# Patient Record
Sex: Female | Born: 1965 | Race: Asian | Hispanic: No | Marital: Married | Smoking: Never smoker
Health system: Southern US, Community
[De-identification: ages and names within clinical notes are randomized; demographics above are authoritative.]

## PROBLEM LIST (undated history)

## (undated) DIAGNOSIS — I10 Essential (primary) hypertension: Secondary | ICD-10-CM

---

## 2005-01-24 ENCOUNTER — Other Ambulatory Visit: Admission: RE | Admit: 2005-01-24 | Discharge: 2005-01-24 | Payer: Self-pay | Admitting: Obstetrics and Gynecology

## 2005-03-08 ENCOUNTER — Ambulatory Visit (HOSPITAL_COMMUNITY): Admission: RE | Admit: 2005-03-08 | Discharge: 2005-03-08 | Payer: Self-pay | Admitting: Obstetrics and Gynecology

## 2005-04-18 ENCOUNTER — Ambulatory Visit (HOSPITAL_COMMUNITY): Admission: RE | Admit: 2005-04-18 | Discharge: 2005-04-18 | Payer: Self-pay | Admitting: Obstetrics and Gynecology

## 2005-05-06 ENCOUNTER — Inpatient Hospital Stay (HOSPITAL_COMMUNITY): Admission: AD | Admit: 2005-05-06 | Discharge: 2005-05-08 | Payer: Self-pay | Admitting: Obstetrics and Gynecology

## 2008-02-24 ENCOUNTER — Emergency Department (HOSPITAL_COMMUNITY): Admission: EM | Admit: 2008-02-24 | Discharge: 2008-02-25 | Payer: Self-pay | Admitting: Emergency Medicine

## 2008-03-01 ENCOUNTER — Emergency Department (HOSPITAL_COMMUNITY): Admission: EM | Admit: 2008-03-01 | Discharge: 2008-03-01 | Payer: Self-pay | Admitting: Emergency Medicine

## 2008-03-03 ENCOUNTER — Ambulatory Visit (HOSPITAL_COMMUNITY): Admission: RE | Admit: 2008-03-03 | Discharge: 2008-03-03 | Payer: Self-pay | Admitting: Emergency Medicine

## 2008-03-10 ENCOUNTER — Emergency Department (HOSPITAL_COMMUNITY): Admission: EM | Admit: 2008-03-10 | Discharge: 2008-03-10 | Payer: Self-pay | Admitting: Emergency Medicine

## 2009-07-16 ENCOUNTER — Emergency Department (HOSPITAL_COMMUNITY): Admission: EM | Admit: 2009-07-16 | Discharge: 2009-07-17 | Payer: Self-pay | Admitting: Emergency Medicine

## 2011-01-14 ENCOUNTER — Encounter: Payer: Self-pay | Admitting: Emergency Medicine

## 2011-04-01 LAB — COMPREHENSIVE METABOLIC PANEL
AST: 31 U/L (ref 0–37)
Alkaline Phosphatase: 50 U/L (ref 39–117)
BUN: 12 mg/dL (ref 6–23)
CO2: 26 mEq/L (ref 19–32)
Calcium: 8.8 mg/dL (ref 8.4–10.5)
Chloride: 110 mEq/L (ref 96–112)
GFR calc non Af Amer: >60 mL/min (ref >60)
Glucose, Bld: 121 mg/dL — ABNORMAL HIGH (ref 70–99)
Potassium: 3.2 mEq/L — ABNORMAL LOW (ref 3.5–5.1)
Sodium: 141 mEq/L (ref 135–145)
Total Bilirubin: 0.7 mg/dL (ref 0.3–1.2)

## 2011-04-01 LAB — DIFFERENTIAL
Basophils Absolute: 0 10*3/uL (ref 0.0–0.1)
Eosinophils Relative: 0 % (ref 0–5)
Neutrophils Relative %: 78 % — ABNORMAL HIGH (ref 43–77)

## 2011-04-01 LAB — CBC
Hemoglobin: 13.2 g/dL (ref 12.0–15.0)
MCV: 91.8 fL (ref 78.0–100.0)
RDW: 12.8 % (ref 11.5–15.5)

## 2011-04-01 LAB — ACETAMINOPHEN LEVEL: Acetaminophen (Tylenol), Serum: <10 ug/mL — ABNORMAL LOW (ref 10–30)

## 2011-04-01 LAB — RAPID URINE DRUG SCREEN, HOSP PERFORMED
Amphetamines: NOT DETECTED
Barbiturates: NOT DETECTED
Benzodiazepines: NOT DETECTED

## 2011-04-01 LAB — SALICYLATE LEVEL: Salicylate Lvl: <4 mg/dL (ref 2.8–20.0)

## 2011-05-11 NOTE — H&P (Signed)
NAMESAHARRA, SANTO                   ACCOUNT NO.:  [id number]   MEDICAL RECORD NO.:  [id number]          PATIENT TYPE:  INP   LOCATION:  9123                          FACILITY:  [facility]   PHYSICIAN:  Crist Fat. Rivard, M.D. DATE OF BIRTH:  [birth year]   DATE OF ADMISSION:  05/06/2005  DATE OF DISCHARGE:                                HISTORY & PHYSICAL   HISTORY OF PRESENT ILLNESS:  Ms. Silveria is a 45 year old gravida 4, para 3-0-0-  3, who presents at 39-5/7 weeks, EDD May 08, 2005, by ultrasound.  She  presents by EMS in early active labor.  The patient is [place]  and speaks limited [place].  Her interpreter is present and she reports  positive fetal movement, no vaginal bleeding, no rupture of membranes.  She  denies any headache, visual changes, or epigastric pain.  Her pregnancy has  been followed by the M.D. service of [facility] and is remarkable for:  1.  Language barrier/[place] 2.  Late to care; 3.  ?LMP; 4.  Advanced maternal age; 5.  Anemia; 6.  Group B Strep negative; 7.  Hookworm  - treat postpartum.   OB HISTORY:  In 72 the patient had a female infant at term vaginally with no  complications at home in [place].  In 1993 the patient had a vaginal  delivery at term of a female infant at home in [place] with no complications,  and in 1995 the patient had a vaginal delivery at home in [place] with the  birth of a female infant and no complications, and the present pregnancy.  This patient began prenatal care at the office of [facility] on January 24, 2005  at approximately 28 weeks' gestation, [facility] determined by ultrasound.  At 24  weeks the patient's stool for ova and parasites was noted to be positive for  hookworm.  Consult was made with Dr. Orvan Falconer with infectious disease and  treatment will be postpartum with vermox 100 mg p.o. b.i.d.  The patient is  not symptomatic and will not be treated therefore in pregnancy.  She was  size less than dates throughout her  pregnancy and initially a low-lying  placenta was noted which has resolved.  She has had a normal AFI and  estimated fetal weight in the 50-75th percentile throughout her pregnancy.  Her last ultrasound was at 38 weeks which showed a single intrauterine  pregnancy in the vertex presentation, posterior placenta.  Low-lying  placenta had resolved.  AFI was 19.2 and estimated fetal weight was 3154 g  in the 50-75th percentile.  She has been normotensive throughout her  pregnancy with no proteinuria.   ALLERGIES:  She has no known drug allergies.   HABITS:  She denies the use of tobacco, alcohol, or illicit drugs.   Her interpreter is Hin Vicki Mallet.   PAST MEDICAL HISTORY:  Significant for anemia during pregnancy, otherwise  the patient's medical history is unremarkable.   FAMILY HISTORY:  She is unsure.  She is of advanced maternal age.  There is  no other history of any genetic disorders.  SOCIAL HISTORY:  Ms. Kneisley is a single [place] woman.  The father's name  is Olga Millers.  They are both factory workers.  They are [place] in their  faith.   REVIEW OF SYSTEMS:  As described above.  The patient is at term in early  active labor.   PHYSICAL EXAMINATION:  VITAL SIGNS:  Stable.  The patient is afebrile.  HEENT:  Unremarkable.  HEART:  Regular in its rate and rhythm.  LUNGS:  Clear.  ABDOMEN:  Gravid in its contour.  The latest ultrasound at 38 weeks showed  the baby to be in the vertex presentation with an estimated fetal weight of  3154 g.  The baseline of the fetal heart rate monitor is 140s with average  long term variability.  Reactivity is present with no periodic changes.  The  patient is contracting every 1-2 minutes.  CERVIX:  Digital exam of the cervix and MAU by R.N. found it to be 5 cm  dilated, completely effaced, with a vertex at a -1 station and a bulging bag  of waters.  EXTREMITIES:  Show no pathologic edema.  DTRs are 1+ with no clonus.   ASSESSMENT:  Intrauterine  pregnancy at term, early active labor.   PLAN:  Admit per Dr. Dois Davenport Rivard.  Routine M.D. orders.  Anticipate  spontaneous vaginal delivery.      SDM/MEDQ  D:  05/06/2005  T:  05/06/2005  Job:  [id number]

## 2011-09-17 LAB — POCT URINALYSIS DIP (DEVICE)
Glucose, UA: NEGATIVE
Nitrite: NEGATIVE
Operator id: 235561
Specific Gravity, Urine: 1.015
Urobilinogen, UA: 0.2
pH: 7

## 2011-09-17 LAB — URINE MICROSCOPIC-ADD ON

## 2011-09-17 LAB — URINALYSIS, ROUTINE W REFLEX MICROSCOPIC
Ketones, ur: 15 — AB
Nitrite: POSITIVE — AB
Protein, ur: >300 — AB
Specific Gravity, Urine: 1.023

## 2011-09-17 LAB — PREGNANCY, URINE: Preg Test, Ur: NEGATIVE

## 2011-09-17 LAB — COMPREHENSIVE METABOLIC PANEL
Albumin: 4.3
Calcium: 9.4
Chloride: 103
Creatinine, Ser: 0.56
Glucose, Bld: 93
Potassium: 4.6
Total Protein: 7.8

## 2011-09-17 LAB — URINE CULTURE: Colony Count: >=100000

## 2012-06-13 ENCOUNTER — Ambulatory Visit
Admission: RE | Admit: 2012-06-13 | Discharge: 2012-06-13 | Disposition: A | Discharge status: Home / Self Care | Payer: PRIVATE HEALTH INSURANCE | Source: Ambulatory Visit | Attending: Cardiovascular Disease | Admitting: Cardiovascular Disease

## 2012-06-13 ENCOUNTER — Other Ambulatory Visit: Payer: Self-pay | Admitting: Cardiovascular Disease

## 2012-06-13 DIAGNOSIS — R42 Dizziness and giddiness: Secondary | ICD-10-CM

## 2012-06-13 DIAGNOSIS — R51 Headache: Secondary | ICD-10-CM

## 2013-03-10 ENCOUNTER — Emergency Department (HOSPITAL_COMMUNITY)
Admission: EM | Admit: 2013-03-10 | Discharge: 2013-03-10 | Disposition: A | Discharge status: Home / Self Care | Payer: PRIVATE HEALTH INSURANCE | Attending: Emergency Medicine | Admitting: Emergency Medicine

## 2013-03-10 ENCOUNTER — Emergency Department (HOSPITAL_COMMUNITY): Payer: PRIVATE HEALTH INSURANCE

## 2013-03-10 ENCOUNTER — Encounter (HOSPITAL_COMMUNITY): Payer: Self-pay | Admitting: Cardiology

## 2013-03-10 DIAGNOSIS — Y9389 Activity, other specified: Secondary | ICD-10-CM | POA: Insufficient documentation

## 2013-03-10 DIAGNOSIS — S4980XA Other specified injuries of shoulder and upper arm, unspecified arm, initial encounter: Secondary | ICD-10-CM | POA: Insufficient documentation

## 2013-03-10 DIAGNOSIS — M25512 Pain in left shoulder: Secondary | ICD-10-CM

## 2013-03-10 DIAGNOSIS — S46909A Unspecified injury of unspecified muscle, fascia and tendon at shoulder and upper arm level, unspecified arm, initial encounter: Secondary | ICD-10-CM | POA: Insufficient documentation

## 2013-03-10 DIAGNOSIS — S0990XA Unspecified injury of head, initial encounter: Secondary | ICD-10-CM | POA: Insufficient documentation

## 2013-03-10 MED ORDER — HYDROCODONE-ACETAMINOPHEN 5-325 MG PO TABS
1.0000 | ORAL_TABLET | Freq: Once | ORAL | Status: AC
  Administered 2013-03-10: 1 via ORAL
  Filled 2013-03-10: qty 1
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
## 2013-04-19 ENCOUNTER — Encounter (HOSPITAL_COMMUNITY): Payer: Self-pay | Admitting: Adult Health

## 2013-04-19 ENCOUNTER — Emergency Department (INDEPENDENT_AMBULATORY_CARE_PROVIDER_SITE_OTHER)
Admission: EM | Admit: 2013-04-19 | Discharge: 2013-04-19 | Disposition: A | Discharge status: Nursing Home (Includes ICF) | Payer: Self-pay | Source: Home / Self Care

## 2013-04-19 ENCOUNTER — Emergency Department (HOSPITAL_COMMUNITY)
Admission: EM | Admit: 2013-04-19 | Discharge: 2013-04-20 | Discharge status: Against Medical Advice | Payer: PRIVATE HEALTH INSURANCE | Attending: Emergency Medicine | Admitting: Emergency Medicine

## 2013-04-19 ENCOUNTER — Encounter (HOSPITAL_COMMUNITY): Payer: Self-pay | Admitting: Emergency Medicine

## 2013-04-19 DIAGNOSIS — R55 Syncope and collapse: Secondary | ICD-10-CM

## 2013-04-19 DIAGNOSIS — M542 Cervicalgia: Secondary | ICD-10-CM | POA: Diagnosis present

## 2013-04-19 DIAGNOSIS — R42 Dizziness and giddiness: Secondary | ICD-10-CM

## 2013-04-19 DIAGNOSIS — S46812S Strain of other muscles, fascia and tendons at shoulder and upper arm level, left arm, sequela: Secondary | ICD-10-CM

## 2013-04-19 DIAGNOSIS — IMO0002 Reserved for concepts with insufficient information to code with codable children: Secondary | ICD-10-CM

## 2013-04-19 DIAGNOSIS — R51 Headache: Secondary | ICD-10-CM

## 2013-04-19 MED ORDER — IBUPROFEN 200 MG PO TABS: 400.0000 mg | ORAL_TABLET | Freq: Four times a day (QID) | ORAL | Status: DC | PRN
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
## 2013-04-19 NOTE — ED Notes (Signed)
The pt has waited at [facility] and does not want to wait here.  Asked to return if problems
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
## 2013-04-26 ENCOUNTER — Emergency Department (HOSPITAL_COMMUNITY)
Admission: EM | Admit: 2013-04-26 | Discharge: 2013-04-26 | Disposition: A | Discharge status: Home / Self Care | Payer: PRIVATE HEALTH INSURANCE | Attending: Emergency Medicine | Admitting: Emergency Medicine

## 2013-04-26 ENCOUNTER — Emergency Department (HOSPITAL_COMMUNITY): Payer: PRIVATE HEALTH INSURANCE

## 2013-04-26 ENCOUNTER — Encounter (HOSPITAL_COMMUNITY): Payer: Self-pay | Admitting: Family Medicine

## 2013-04-26 DIAGNOSIS — M25519 Pain in unspecified shoulder: Secondary | ICD-10-CM | POA: Insufficient documentation

## 2013-04-26 DIAGNOSIS — F0781 Postconcussional syndrome: Secondary | ICD-10-CM | POA: Insufficient documentation

## 2013-04-26 DIAGNOSIS — R55 Syncope and collapse: Secondary | ICD-10-CM | POA: Insufficient documentation

## 2013-04-26 DIAGNOSIS — M542 Cervicalgia: Secondary | ICD-10-CM | POA: Insufficient documentation

## 2013-04-26 DIAGNOSIS — Z87828 Personal history of other (healed) physical injury and trauma: Secondary | ICD-10-CM | POA: Insufficient documentation

## 2013-04-26 DIAGNOSIS — R110 Nausea: Secondary | ICD-10-CM | POA: Insufficient documentation

## 2013-04-26 MED ORDER — IBUPROFEN 800 MG PO TABS: 800.0000 mg | ORAL_TABLET | Freq: Three times a day (TID) | ORAL | Status: DC | PRN

## 2013-04-26 MED ORDER — MECLIZINE HCL 25 MG PO TABS
25.0000 mg | ORAL_TABLET | Freq: Once | ORAL | Status: AC
  Administered 2013-04-26: 25 mg via ORAL
  Filled 2013-04-26: qty 1

## 2013-04-26 MED ORDER — MECLIZINE HCL 25 MG PO TABS: 25.0000 mg | ORAL_TABLET | Freq: Three times a day (TID) | ORAL | Status: DC | PRN

## 2013-04-26 MED ORDER — KETOROLAC TROMETHAMINE 60 MG/2ML IM SOLN
60.0000 mg | Freq: Once | INTRAMUSCULAR | Status: AC
  Administered 2013-04-26: 60 mg via INTRAMUSCULAR
  Filled 2013-04-26: qty 2
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
## 2013-04-26 NOTE — ED Notes (Signed)
Patient transported to CT 
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
## 2013-04-26 NOTE — ED Notes (Signed)
Discharge instructions explained to pt and daughter in law. Both verbalized understanding.

## 2013-04-26 NOTE — ED Provider Notes (Signed)
Medical screening examination/treatment/procedure(s) were performed by non-physician practitioner and as supervising physician I was immediately available for consultation/collaboration.    Celene Kras, MD 04/26/13 [phone]

## 2013-04-26 NOTE — ED Notes (Signed)
Pt speaks [place] Musician.

## 2013-04-28 NOTE — ED Provider Notes (Signed)
Medical screening examination/treatment/procedure(s) were performed by resident physician or non-physician practitioner and as supervising physician I was immediately available for consultation/collaboration.   Barkley Bruns MD.   Linna Hoff, MD 04/28/13 [phone]

## 2013-06-12 ENCOUNTER — Other Ambulatory Visit: Payer: Self-pay

## 2013-06-12 DIAGNOSIS — Z1231 Encounter for screening mammogram for malignant neoplasm of breast: Secondary | ICD-10-CM

## 2013-06-19 ENCOUNTER — Ambulatory Visit
Admission: RE | Admit: 2013-06-19 | Discharge: 2013-06-19 | Disposition: A | Discharge status: Home / Self Care | Payer: PRIVATE HEALTH INSURANCE | Source: Ambulatory Visit

## 2013-06-19 DIAGNOSIS — Z1231 Encounter for screening mammogram for malignant neoplasm of breast: Secondary | ICD-10-CM

## 2013-06-23 ENCOUNTER — Other Ambulatory Visit: Payer: Self-pay | Admitting: Obstetrics & Gynecology

## 2013-06-23 DIAGNOSIS — R928 Other abnormal and inconclusive findings on diagnostic imaging of breast: Secondary | ICD-10-CM

## 2013-07-22 ENCOUNTER — Inpatient Hospital Stay: Admission: RE | Admit: 2013-07-22 | Payer: PRIVATE HEALTH INSURANCE | Source: Ambulatory Visit

## 2016-11-06 ENCOUNTER — Ambulatory Visit (INDEPENDENT_AMBULATORY_CARE_PROVIDER_SITE_OTHER): Payer: BLUE CROSS/BLUE SHIELD | Admitting: Physician Assistant

## 2016-11-06 ENCOUNTER — Ambulatory Visit (INDEPENDENT_AMBULATORY_CARE_PROVIDER_SITE_OTHER): Payer: BLUE CROSS/BLUE SHIELD

## 2016-11-06 VITALS — BP 122/78 | HR 73 | Temp 98.9°F | Resp 18 | Ht 60.0 in | Wt 119.2 lb

## 2016-11-06 DIAGNOSIS — E7800 Pure hypercholesterolemia, unspecified: Secondary | ICD-10-CM | POA: Insufficient documentation

## 2016-11-06 DIAGNOSIS — R0602 Shortness of breath: Secondary | ICD-10-CM

## 2016-11-06 DIAGNOSIS — R0989 Other specified symptoms and signs involving the circulatory and respiratory systems: Secondary | ICD-10-CM

## 2016-11-06 DIAGNOSIS — F458 Other somatoform disorders: Secondary | ICD-10-CM

## 2016-11-06 DIAGNOSIS — R1013 Epigastric pain: Secondary | ICD-10-CM | POA: Diagnosis not present

## 2016-11-06 LAB — COMPLETE METABOLIC PANEL WITH GFR
ALBUMIN: 4.7 g/dL (ref 3.6–5.1)
ALK PHOS: 75 U/L (ref 33–115)
ALT: 16 U/L (ref 6–29)
AST: 18 U/L (ref 10–35)
BUN: 15 mg/dL (ref 7–25)
CALCIUM: 9.6 mg/dL (ref 8.6–10.2)
CO2: 30 mmol/L (ref 20–31)
Chloride: 101 mmol/L (ref 98–110)
Creat: 0.61 mg/dL (ref 0.50–1.10)
Glucose, Bld: 93 mg/dL (ref 65–99)
POTASSIUM: 3.7 mmol/L (ref 3.5–5.3)
Sodium: 139 mmol/L (ref 135–146)
Total Bilirubin: 0.7 mg/dL (ref 0.2–1.2)
Total Protein: 8.5 g/dL — ABNORMAL HIGH (ref 6.1–8.1)

## 2016-11-06 MED ORDER — RANITIDINE HCL 150 MG PO TABS: 150.0000 mg | ORAL_TABLET | Freq: Two times a day (BID) | ORAL | 0 refills | Status: DC

## 2016-11-06 NOTE — Patient Instructions (Signed)
I will contact you with your lab results as soon as they are available.   If you have not heard from me in 2 weeks, please contact me.  The fastest way to get your results is to register for My Chart (see the instructions on the last page of this printout).    Heartburn Introduction Heartburn is a type of pain or discomfort that can happen in the throat or chest. It is often described as a burning pain. It may also cause a bad taste in the mouth. Heartburn may feel worse when you lie down or bend over. It may be caused by stomach contents that move back up (reflux) into the tube that connects the mouth with the stomach (esophagus). Follow these instructions at home: Take these actions to lessen your discomfort and to help avoid problems. Diet  Follow a diet as told by your doctor. You may need to avoid foods and drinks such as:  Coffee and tea (with or without caffeine).  Drinks that contain alcohol.  Energy drinks and sports drinks.  Carbonated drinks or sodas.  Chocolate and cocoa.  Peppermint and mint flavorings.  Garlic and onions.  Horseradish.  Spicy and acidic foods, such as peppers, chili powder, curry powder, vinegar, hot sauces, and BBQ sauce.  Citrus fruit juices and citrus fruits, such as oranges, lemons, and limes.  Tomato-based foods, such as red sauce, chili, salsa, and pizza with red sauce.  Fried and fatty foods, such as donuts, french fries, potato chips, and high-fat dressings.  High-fat meats, such as hot dogs, rib eye steak, sausage, ham, and bacon.  High-fat dairy items, such as whole milk, butter, and cream cheese.  Eat small meals often. Avoid eating large meals.  Avoid drinking large amounts of liquid with your meals.  Avoid eating meals during the 2-3 hours before bedtime.  Avoid lying down right after you eat.  Do not exercise right after you eat. General instructions  Pay attention to any changes in your symptoms.  Take  over-the-counter and prescription medicines only as told by your doctor. Do not take aspirin, ibuprofen, or other NSAIDs unless your doctor says it is okay.  Do not use any tobacco products, including cigarettes, chewing tobacco, and e-cigarettes. If you need help quitting, ask your doctor.  Wear loose clothes. Do not wear anything tight around your waist.  Raise (elevate) the head of your bed about 6 inches (15 cm).  Try to lower your stress. If you need help doing this, ask your doctor.  If you are overweight, lose an amount of weight that is healthy for you. Ask your doctor about a safe weight loss goal.  Keep all follow-up visits as told by your doctor. This is important. Contact a doctor if:  You have new symptoms.  You lose weight and you do not know why it is happening.  You have trouble swallowing, or it hurts to swallow.  You have wheezing or a cough that keeps happening.  Your symptoms do not get better with treatment.  You have heartburn often for more than two weeks. Get help right away if:  You have pain in your arms, neck, jaw, teeth, or back.  You feel sweaty, dizzy, or light-headed.  You have chest pain or shortness of breath.  You throw up (vomit) and your throw up looks like blood or coffee grounds.  Your poop (stool) is bloody or black. This information is not intended to replace advice given to you by your  health care provider. Make sure you discuss any questions you have with your health care provider. Document Released: 08/22/2011 Document Revised: 05/17/2016 Document Reviewed: 04/06/2015  2017 [employer]

## 2016-11-06 NOTE — Progress Notes (Signed)
Barbara RinksHbli Selway  MRN: [id number] DOB: 02/26/1966  Subjective:  Pt presents to clinic with difficulty breathing at night - she feels like she is drowning.  She feels like the sensation is coming from her throat.  She has no current cold symptoms.  This sensation never happens during the day only when she is laying down sleeping.  Happens about 3-4 times a week. When this wakes her up she is not able to fall back asleep because she is scared she will not wake up.  This has been going on for several months but at the beginning it was only once in a while but over the last month is when the current frequency started.  When she wakes from sleep with this sensation she also feels like something is stuck in her stomach and when she stretches the sensation goes away and the breathing is better.  No sour/bitter taste in mouth.  She has no chest pain and no increase in heart rate during the feelings of SOB.  She has tried no mediations and nothing to help with the symptoms.  She does not use a pillow to sleep.  She does not have PND.  She has reduced the size of her meals because of similar sensation. No asthma history and she is not a smoker. Family Hx - unknown  Review of Systems  Constitutional: Negative for chills and fever.  HENT: Negative.   Respiratory: Positive for shortness of breath. Negative for wheezing.   Cardiovascular: Negative for chest pain, palpitations and leg swelling.  Gastrointestinal: Positive for abdominal pain (epigastric) and constipation. Negative for diarrhea and vomiting.    Patient Active Problem List   Diagnosis Date Noted  . Elevated cholesterol 11/06/2016    No current outpatient prescriptions on file prior to visit.   No current facility-administered medications on file prior to visit.     No Known Allergies  Pt patients past, family and social history were reviewed and updated.   Objective:  BP 122/78 (BP Location: Right Arm, Patient Position: Sitting, Cuff Size:  Normal)   Pulse 73   Temp 98.9 F (37.2 C) (Oral)   Resp 18   Ht 5' (1.524 m)   Wt 119 lb 3.2 oz (54.1 kg)   SpO2 98%   BMI 23.28 kg/m   Physical Exam  Constitutional: She is oriented to person, place, and time and well-developed, well-nourished, and in no distress.  HENT:  Head: Normocephalic and atraumatic.  Right Ear: Hearing and external ear normal.  Left Ear: Hearing and external ear normal.  Eyes: Conjunctivae are normal.  Neck: Normal range of motion.  Cardiovascular: Normal rate, regular rhythm and normal heart sounds.   No murmur heard. Pulmonary/Chest: Effort normal and breath sounds normal. She has no wheezes.  No JVD  Abdominal: There is tenderness (mild epigastric TTP without radiation into her chest).  Neurological: She is alert and oriented to person, place, and time. Gait normal.  Skin: Skin is warm and dry.  Psychiatric: Mood, memory, affect and judgment normal.  Vitals reviewed.  Dg Chest 2 View  Result Date: 11/06/2016 CLINICAL DATA:  Shortness of breath while sleeping EXAM: CHEST  2 VIEW COMPARISON:  03/10/2013 FINDINGS: The heart size and mediastinal contours are within normal limits. Both lungs are clear. The visualized skeletal structures are unremarkable. IMPRESSION: Negative chest. Electronically Signed   By: Marnee SpringJonathon  Watts M.D.   On: 11/06/2016 12:37   EKG reading with Dr Neva SeatGreene - NSR without acute change  Assessment and Plan :  SOB (shortness of breath) - Plan: DG Chest 2 View, EKG 12-Lead  Elevated cholesterol  Abdominal pain, epigastric - Plan: COMPLETE METABOLIC PANEL WITH GFR, H. pylori breath test --   Normal CXR, normal EKG - likely globus sensation 2nd to reflux - while waiting on the lab results we will treat with a zantac daily until the results return - if she is improved - great she will continue that for a full month - if no improvement we will consider a PPI at that time or if she has + h pylori we will do that treatment.  Discussed  with daughter and patient and they understand and agree with that plan.  D/w Dr Carman ChingGreene  Dhillon [place] PA-C  [facility] 11/06/2016 12:46 PM

## 2016-11-06 NOTE — Progress Notes (Signed)
     IF you received an x-ray today, you will receive an invoice from [facility]. Please contact [facility] at [phone] with questions or concerns regarding your invoice.   IF you received labwork today, you will receive an invoice from [facility]. Please contact [facility] at [phone] with questions or concerns regarding your invoice.   Our billing staff will not be able to assist you with questions regarding bills from these companies.  You will be contacted with the lab results as soon as they are available. The fastest way to get your results is to activate your My Chart account. Instructions are located on the last page of this paperwork. If you have not heard from us regarding the results in 2 weeks, please contact this office.      

## 2016-11-07 LAB — H. PYLORI BREATH TEST: H. pylori Breath Test: NOT DETECTED

## 2016-11-08 ENCOUNTER — Encounter: Payer: Self-pay | Admitting: Physician Assistant

## 2017-03-10 ENCOUNTER — Emergency Department (HOSPITAL_COMMUNITY)
Admission: EM | Admit: 2017-03-10 | Discharge: 2017-03-11 | Disposition: A | Discharge status: Home / Self Care | Payer: BLUE CROSS/BLUE SHIELD | Attending: Emergency Medicine | Admitting: Emergency Medicine

## 2017-03-10 ENCOUNTER — Encounter (HOSPITAL_COMMUNITY): Payer: Self-pay

## 2017-03-10 DIAGNOSIS — R0989 Other specified symptoms and signs involving the circulatory and respiratory systems: Secondary | ICD-10-CM

## 2017-03-10 DIAGNOSIS — R109 Unspecified abdominal pain: Secondary | ICD-10-CM | POA: Diagnosis present

## 2017-03-10 DIAGNOSIS — K279 Peptic ulcer, site unspecified, unspecified as acute or chronic, without hemorrhage or perforation: Secondary | ICD-10-CM | POA: Insufficient documentation

## 2017-03-10 LAB — URINALYSIS, ROUTINE W REFLEX MICROSCOPIC
Bilirubin Urine: NEGATIVE
GLUCOSE, UA: NEGATIVE mg/dL
Hgb urine dipstick: NEGATIVE
KETONES UR: NEGATIVE mg/dL
LEUKOCYTES UA: NEGATIVE
NITRITE: NEGATIVE
PH: 7 (ref 5.0–8.0)
PROTEIN: NEGATIVE mg/dL
Specific Gravity, Urine: 1.019 (ref 1.005–1.030)

## 2017-03-10 LAB — COMPREHENSIVE METABOLIC PANEL
ALBUMIN: 4.2 g/dL (ref 3.5–5.0)
ALK PHOS: 73 U/L (ref 38–126)
ALT: 18 U/L (ref 14–54)
ANION GAP: 13 (ref 5–15)
AST: 21 U/L (ref 15–41)
BILIRUBIN TOTAL: 0.6 mg/dL (ref 0.3–1.2)
BUN: 24 mg/dL — AB (ref 6–20)
CALCIUM: 9.7 mg/dL (ref 8.9–10.3)
CO2: 26 mmol/L (ref 22–32)
CREATININE: 0.66 mg/dL (ref 0.44–1.00)
Chloride: 99 mmol/L — ABNORMAL LOW (ref 101–111)
GFR calc Af Amer: >60 mL/min (ref >60)
GFR calc non Af Amer: >60 mL/min (ref >60)
GLUCOSE: 109 mg/dL — AB (ref 65–99)
Potassium: 4 mmol/L (ref 3.5–5.1)
Sodium: 138 mmol/L (ref 135–145)
TOTAL PROTEIN: 8.1 g/dL (ref 6.5–8.1)

## 2017-03-10 LAB — CBC
HEMATOCRIT: 42.6 % (ref 36.0–46.0)
Hemoglobin: 14.5 g/dL (ref 12.0–15.0)
MCH: 31 pg (ref 26.0–34.0)
MCHC: 34 g/dL (ref 30.0–36.0)
MCV: 91 fL (ref 78.0–100.0)
PLATELETS: 292 10*3/uL (ref 150–400)
RBC: 4.68 MIL/uL (ref 3.87–5.11)
RDW: 13.3 % (ref 11.5–15.5)
WBC: 11 10*3/uL — ABNORMAL HIGH (ref 4.0–10.5)

## 2017-03-10 LAB — LIPASE, BLOOD: Lipase: 41 U/L (ref 11–51)

## 2017-03-10 MED ORDER — FAMOTIDINE 20 MG PO TABS
40.0000 mg | ORAL_TABLET | Freq: Once | ORAL | Status: AC
  Administered 2017-03-11: 40 mg via ORAL
  Filled 2017-03-10: qty 2

## 2017-03-10 MED ORDER — SUCRALFATE 1 G PO TABS
1.0000 g | ORAL_TABLET | Freq: Once | ORAL | Status: AC
  Administered 2017-03-11: 1 g via ORAL
  Filled 2017-03-10: qty 1

## 2017-03-10 NOTE — ED Provider Notes (Signed)
[facility]-EMERGENCY DEPT Provider Note   CSN: [id number] Arrival date & time: 03/10/17  2056 By signing my name below, I, Bridgette Habermann, attest that this documentation has been prepared under the direction and in the presence of Derwood Kaplan, MD. Electronically Signed: Bridgette Habermann, ED Scribe. 03/10/17. 11:37 PM.  History   Chief Complaint Chief Complaint  Patient presents with  . Abdominal Pain    HPI The history is provided by the patient and a relative. A language interpreter was used.   HPI Comments: Barbara Hanson is a 51 y.o. female with h/o GERD, who presents to the Emergency Department complaining of generalized abdominal pain onset 3 weeks ago, worsening tonight. Pt states pain is burning in quality. Pt also has associated dysgeusia. She has decreased appetite secondary to her symptoms. Per relative at bedside, pt is prescribed Zantac 150mg  BID for GERD and ran out one month ago. No h/o colonoscopy or abdominal surgeries. Relative at bedside denies h/o DM, MI, HTN. Pt denies fever, chills, vomiting, hematochezia, back pain, or any other associated symptoms.  History reviewed. No pertinent past medical history.  Patient Active Problem List   Diagnosis Date Noted  . Elevated cholesterol 11/06/2016    History reviewed. No pertinent surgical history.  OB History    No data available       Home Medications    Prior to Admission medications   Medication Sig Start Date End Date Taking? Authorizing Provider  ranitidine (ZANTAC) 150 MG tablet Take 1 tablet (150 mg total) by mouth 2 (two) times daily. 03/11/17   Derwood Kaplan, MD    Family History History reviewed. No pertinent family history.  Social History Social History  Substance Use Topics  . Smoking status: Never Smoker  . Smokeless tobacco: Never Used  . Alcohol use No     Allergies   Patient has no known allergies.   Review of Systems Review of Systems 10 Systems reviewed and all are negative for acute change  except as noted in the HPI. Physical Exam Updated Vital Signs BP 116/76   Pulse 63   Temp 98.6 F (37 C)   Resp (!) 28   SpO2 95%   Physical Exam  Constitutional: She appears well-developed and well-nourished.  HENT:  Head: Normocephalic.  Eyes: Conjunctivae are normal.  Cardiovascular: Normal rate, regular rhythm, normal heart sounds and intact distal pulses.  Exam reveals no gallop and no friction rub.   No murmur heard. Pulmonary/Chest: Effort normal and breath sounds normal. No respiratory distress. She has no wheezes. She has no rales.  Abdominal: Soft. Bowel sounds are normal. She exhibits no distension.  Musculoskeletal: Normal range of motion.  Neurological: She is alert.  Skin: Skin is warm and dry.  Psychiatric: She has a normal mood and affect. Her behavior is normal.  Nursing note and vitals reviewed.    ED Treatments / Results  DIAGNOSTIC STUDIES: Oxygen Saturation is 98% on RA, normal by my interpretation.    COORDINATION OF CARE: 11:37 PM Discussed treatment plan with pt at bedside and pt agreed to plan.  Labs (all labs ordered are listed, but only abnormal results are displayed) Labs Reviewed  COMPREHENSIVE METABOLIC PANEL - Abnormal; Notable for the following:       Result Value   Chloride 99 (*)    Glucose, Bld 109 (*)    BUN 24 (*)    All other components within normal limits  CBC - Abnormal; Notable for the following:    WBC 11.0 (*)  All other components within normal limits  LIPASE, BLOOD  URINALYSIS, ROUTINE W REFLEX MICROSCOPIC    EKG  EKG Interpretation  Date/Time:  Sunday March 10 2017 21:03:20 EDT Ventricular Rate:  73 PR Interval:  126 QRS Duration: 78 QT Interval:  392 QTC Calculation: 431 R Axis:   58 Text Interpretation:  Normal sinus rhythm Normal ECG No acute changes No significant change since last tracing Confirmed by Rhunette CroftNANAVATI, MD, Janey GentaANKIT [phone]([id number]) on 03/10/2017 11:11:47 PM       Radiology No results  found.  Procedures Procedures (including critical care time)  Medications Ordered in ED Medications  sucralfate (CARAFATE) tablet 1 g (1 g Oral Given 03/11/17 0003)  famotidine (PEPCID) tablet 40 mg (40 mg Oral Given 03/11/17 0003)     Initial Impression / Assessment and Plan / ED Course  I have reviewed the triage vital signs and the nursing notes.  Pertinent labs & imaging results that were available during my care of the patient were reviewed by me and considered in my medical decision making (see chart for details).     Pt comes in with cc of abd pain.  DDx includes: Pancreatitis Hepatobiliary pathology including cholecystitis Gastritis/PUD  Pt has abd pain. It is epigastric, burning type pain. Pain is not radiating. Labs are WNL Pt was on zantac and doing well until she ran out. WE will restart the meds. Strict ER return precautions have been discussed, and patient is agreeing with the plan and is comfortable with the workup done and the recommendations from the ER.  D/c with Gi f/u    Final Clinical Impressions(s) / ED Diagnoses   Final diagnoses:  PUD (peptic ulcer disease)    New Prescriptions Current Discharge Medication List     I personally performed the services described in this documentation, which was scribed in my presence. The recorded information has been reviewed and is accurate.    Derwood KaplanAnkit Nissi Doffing, MD 03/11/17 [phone]

## 2017-03-10 NOTE — ED Notes (Signed)
Per pt's family pt is to take ranitidine bid however has been out of it since 12/17.  Per family pt has had burning and throbbing to epigastric area x 3 weeks.

## 2017-03-10 NOTE — ED Triage Notes (Signed)
Pt complaining of heart burn in stomach and chest. Pt complaining of bitterness in mouth. Pt states no appetite.

## 2017-03-10 NOTE — ED Notes (Signed)
Pt states seen by PCP for same. Pt states rx'd zantac. Pt states ran out of rx 2 weeks ago.

## 2017-03-11 MED ORDER — RANITIDINE HCL 150 MG PO TABS: 150.0000 mg | ORAL_TABLET | Freq: Two times a day (BID) | ORAL | 1 refills | Status: DC

## 2017-03-11 NOTE — Discharge Instructions (Signed)
PLEASE SEE THE GI DOCTOR AS REQUESTED.  Please return to the ER if your symptoms worsen; you have increased pain, fevers, chills, inability to keep any medications down, confusion, bloody vomit, dark tarry stool or bloody stools. Otherwise see the outpatient doctor as requested.

## 2017-03-11 NOTE — ED Notes (Signed)
Pt and family verbalized understanding of discharge instructions and follow up care. NAD noted at discharge, VSS.

## 2018-01-11 ENCOUNTER — Emergency Department (HOSPITAL_COMMUNITY): Payer: BLUE CROSS/BLUE SHIELD

## 2018-01-11 ENCOUNTER — Encounter (HOSPITAL_COMMUNITY): Payer: Self-pay | Admitting: Emergency Medicine

## 2018-01-11 ENCOUNTER — Emergency Department (HOSPITAL_COMMUNITY)
Admission: EM | Admit: 2018-01-11 | Discharge: 2018-01-12 | Disposition: A | Discharge status: Home / Self Care | Payer: BLUE CROSS/BLUE SHIELD | Attending: Emergency Medicine | Admitting: Emergency Medicine

## 2018-01-11 DIAGNOSIS — Z79899 Other long term (current) drug therapy: Secondary | ICD-10-CM | POA: Diagnosis not present

## 2018-01-11 DIAGNOSIS — R0781 Pleurodynia: Secondary | ICD-10-CM

## 2018-01-11 DIAGNOSIS — E876 Hypokalemia: Secondary | ICD-10-CM | POA: Diagnosis not present

## 2018-01-11 DIAGNOSIS — R079 Chest pain, unspecified: Secondary | ICD-10-CM

## 2018-01-11 DIAGNOSIS — R0782 Intercostal pain: Secondary | ICD-10-CM | POA: Insufficient documentation

## 2018-01-11 LAB — CBC
HEMATOCRIT: 41.6 % (ref 36.0–46.0)
HEMOGLOBIN: 14.1 g/dL (ref 12.0–15.0)
MCH: 30.1 pg (ref 26.0–34.0)
MCHC: 33.9 g/dL (ref 30.0–36.0)
MCV: 88.9 fL (ref 78.0–100.0)
Platelets: 283 10*3/uL (ref 150–400)
RBC: 4.68 MIL/uL (ref 3.87–5.11)
RDW: 13 % (ref 11.5–15.5)
WBC: 11.2 10*3/uL — ABNORMAL HIGH (ref 4.0–10.5)

## 2018-01-11 LAB — BASIC METABOLIC PANEL
ANION GAP: 12 (ref 5–15)
BUN: 13 mg/dL (ref 6–20)
CO2: 23 mmol/L (ref 22–32)
Calcium: 9.5 mg/dL (ref 8.9–10.3)
Chloride: 104 mmol/L (ref 101–111)
Creatinine, Ser: 0.53 mg/dL (ref 0.44–1.00)
GFR calc Af Amer: >60 mL/min (ref >60)
GFR calc non Af Amer: >60 mL/min (ref >60)
GLUCOSE: 98 mg/dL (ref 65–99)
POTASSIUM: 3.3 mmol/L — AB (ref 3.5–5.1)
Sodium: 139 mmol/L (ref 135–145)

## 2018-01-11 LAB — HEPATIC FUNCTION PANEL
ALBUMIN: 4.2 g/dL (ref 3.5–5.0)
ALT: 29 U/L (ref 14–54)
AST: 31 U/L (ref 15–41)
Alkaline Phosphatase: 80 U/L (ref 38–126)
BILIRUBIN TOTAL: 1 mg/dL (ref 0.3–1.2)
Bilirubin, Direct: 0.2 mg/dL (ref 0.1–0.5)
Indirect Bilirubin: 0.8 mg/dL (ref 0.3–0.9)
TOTAL PROTEIN: 8.3 g/dL — AB (ref 6.5–8.1)

## 2018-01-11 LAB — I-STAT TROPONIN, ED
TROPONIN I, POC: 0 ng/mL (ref 0.00–0.08)
Troponin i, poc: 0 ng/mL (ref 0.00–0.08)

## 2018-01-11 LAB — D-DIMER, QUANTITATIVE: D-Dimer, Quant: 0.5 ug/mL-FEU (ref 0.00–0.50)

## 2018-01-11 LAB — I-STAT BETA HCG BLOOD, ED (MC, WL, AP ONLY): I-stat hCG, quantitative: <5 m[IU]/mL (ref <5)

## 2018-01-11 MED ORDER — OXYCODONE-ACETAMINOPHEN 5-325 MG PO TABS
1.0000 | ORAL_TABLET | Freq: Once | ORAL | Status: AC
  Administered 2018-01-11: 1 via ORAL
  Filled 2018-01-11: qty 1

## 2018-01-11 MED ORDER — POTASSIUM CHLORIDE CRYS ER 20 MEQ PO TBCR
40.0000 meq | EXTENDED_RELEASE_TABLET | Freq: Once | ORAL | Status: AC
  Administered 2018-01-11: 40 meq via ORAL
  Filled 2018-01-11: qty 2

## 2018-01-11 NOTE — ED Provider Notes (Signed)
[facility] Provider Note   CSN: [id number] Arrival date & time: 01/11/18  1753     History   Chief Complaint Chief Complaint  Patient presents with  . Chest Pain    HPI Barbara Hanson is a 52 y.o. female with past medical history of hyperlipidemia presenting with sudden onset sharp stabbing right lower rib pain which progressed over the last 5 days.  The pain wraps around to her back along her rib cage. Patient reports that she has had a cough for 2 days prior.  She was at work standing and sewing when this first started after coughing.  She denies any prior similar episodes.  Her pain is aggravated by any movement and alleviated by rest.  Has taken 400 mg of ibuprofen yesterday for her symptoms with modest relief.  No medications prior to arrival. She does not know her family history due to adoption. No history of DVT/PE, hemoptysis, leg pain or swelling, recent surgeries, prolonged immobilization or estrogen use. She endorses chills, denies fever. No history of smoking or alcohol use.    Patient speaks EDE and daughter in the room translating for patient.  HPI  History reviewed. No pertinent past medical history.  Patient Active Problem List   Diagnosis Date Noted  . Elevated cholesterol 11/06/2016    History reviewed. No pertinent surgical history.  OB History    No data available       Home Medications    Prior to Admission medications   Medication Sig Start Date End Date Taking? Authorizing Provider  Biotin w/ Vitamins C & E (HAIR/SKIN/NAILS PO) Take 1 tablet by mouth daily.   Yes [provider]  ibuprofen (ADVIL,MOTRIN) 200 MG tablet Take 400 mg by mouth every 6 (six) hours as needed for moderate pain.   Yes [provider]  methocarbamol (ROBAXIN) 500 MG tablet Take 1 tablet (500 mg total) by mouth at bedtime as needed. 01/12/18   Mathews RobinsonsMitchell, Jessica B, PA-C  naproxen (NAPROSYN) 375 MG tablet Take 1 tablet (375  mg total) by mouth 2 (two) times daily. 01/12/18   Georgiana ShoreMitchell, Jessica B, PA-C    Family History History reviewed. No pertinent family history.  Social History Social History   Tobacco Use  . Smoking status: Never Smoker  . Smokeless tobacco: Never Used  Substance Use Topics  . Alcohol use: No  . Drug use: No     Allergies   Patient has no known allergies.   Review of Systems Review of Systems  Constitutional: Positive for chills. Negative for diaphoresis and fever.  HENT: Negative for sore throat and trouble swallowing.   Respiratory: Positive for cough and shortness of breath. Negative for choking, chest tightness, wheezing and stridor.   Cardiovascular: Negative for chest pain, palpitations and leg swelling.  Gastrointestinal: Negative for abdominal distention, abdominal pain, diarrhea, nausea and vomiting.  Genitourinary: Negative for difficulty urinating, dysuria, flank pain, frequency and hematuria.  Musculoskeletal: Positive for back pain and myalgias. Negative for arthralgias, gait problem, joint swelling, neck pain and neck stiffness.  Skin: Negative for color change, pallor, rash and wound.  Neurological: Negative for dizziness, tremors, syncope, facial asymmetry, speech difficulty, weakness, light-headedness, numbness and headaches.     Physical Exam Updated Vital Signs BP 105/74 (BP Location: Right Arm)   Pulse 80   Temp 98.2 F (36.8 C) (Oral)   Resp 18   Ht 5\' 3"  (1.6 m)   Wt 54.4 kg (120 lb)   SpO2  96%   BMI 21.26 kg/m   Physical Exam  Constitutional: She appears well-developed and well-nourished.  Non-toxic appearance. She does not appear ill. No distress.  Afebrile, nontoxic-appearing, lying comfortably in bed no acute distress.  HENT:  Head: Normocephalic and atraumatic.  Eyes: Conjunctivae and EOM are normal.  Neck: Normal range of motion. Neck supple.  Cardiovascular: Normal rate, regular rhythm, intact distal pulses and normal pulses.  No  murmur heard. Pulmonary/Chest: Effort normal and breath sounds normal. No accessory muscle usage or stridor. No tachypnea. No respiratory distress. She has no decreased breath sounds.  Abdominal: Soft. She exhibits no distension, no ascites and no mass. There is no tenderness. There is no rebound and no guarding.  Musculoskeletal: Normal range of motion. She exhibits no edema.       Right lower leg: Normal. She exhibits no tenderness and no edema.       Left lower leg: Normal. She exhibits no tenderness and no edema.  Tender to palpation of the right lower ribs anterior and posterior.  Neurological: She is alert.  Skin: Skin is warm and dry. No rash noted. She is not diaphoretic. No erythema. No pallor.  Psychiatric: She has a normal mood and affect.  Nursing note and vitals reviewed.    ED Treatments / Results  Labs (all labs ordered are listed, but only abnormal results are displayed) Labs Reviewed  BASIC METABOLIC PANEL - Abnormal; Notable for the following components:      Result Value   Potassium 3.3 (*)    All other components within normal limits  CBC - Abnormal; Notable for the following components:   WBC 11.2 (*)    All other components within normal limits  HEPATIC FUNCTION PANEL - Abnormal; Notable for the following components:   Total Protein 8.3 (*)    All other components within normal limits  URINALYSIS, ROUTINE W REFLEX MICROSCOPIC - Abnormal; Notable for the following components:   Hgb urine dipstick SMALL (*)    Squamous Epithelial / LPF 0-5 (*)    All other components within normal limits  D-DIMER, QUANTITATIVE (NOT AT [facility])  I-STAT TROPONIN, ED  I-STAT BETA HCG BLOOD, ED ([facility], [facility], [facility] ONLY)  I-STAT TROPONIN, ED    EKG  EKG Interpretation  Date/Time:  Saturday January 11 2018 18:05:57 EST Ventricular Rate:  78 PR Interval:  158 QRS Duration: 86 QT Interval:  408 QTC Calculation: 465 R Axis:   100 Text Interpretation:  Normal sinus rhythm Rightward axis  Borderline ECG No significant change since last tracing Confirmed by Rochele Raring [id number]) on 01/12/2018 2:33:31 AM       Radiology Dg Chest 2 View  Result Date: 01/11/2018 CLINICAL DATA:  Chest pain EXAM: CHEST  2 VIEW COMPARISON:  11/06/2016 FINDINGS: Borderline heart size. Stable mediastinal contours. There is no edema, consolidation, effusion, or pneumothorax. Possible minimal atelectasis seen along the lingula. IMPRESSION: Borderline heart size.  No edema or pneumonia. Electronically Signed   By: Marnee Spring M.D.   On: 01/11/2018 19:38    Procedures Procedures (including critical care time)  Medications Ordered in ED Medications  potassium chloride SA (K-DUR,KLOR-CON) CR tablet 40 mEq (40 mEq Oral Given 01/11/18 2331)  oxyCODONE-acetaminophen (PERCOCET/ROXICET) 5-325 MG per tablet 1 tablet (1 tablet Oral Given 01/11/18 2332)     Initial Impression / Assessment and Plan / ED Course  I have reviewed the triage vital signs and the nursing notes.  Pertinent labs & imaging results that were available during  my care of the patient were reviewed by me and considered in my medical decision making (see chart for details).    Patient presenting with 5 days of sharp stabbing right lower rib pain. Worse with movement, better with rest. No medications prior to arrival. No personal hx of cardiac disease, unknown family history.  She denies any right upper quadrant pain, negative Murphy's sign, she is afebrile nontoxic appearing.  Pain is reproducible on palpation along the lower ribs to the back.  Right CVA tenderness.  Ordered UA.  Negative  Chest x-ray negative Cannot use PERC due to age. Dimer negative.  Hypokalemia, labs otherwise unremarkable. Patient's pain was managed while in the emergency department. On reassessment, patient reported significant improvement.  Patient is well-appearing and hemodynamically dynamically stable.  Discharge home with symptomatic relief and close  follow-up with PCP if symptoms persist beyond a week.  Discussed strict return precautions and advised to return to the emergency department if experiencing any new or worsening symptoms. Instructions were understood and patient agreed with discharge plan.  Final Clinical Impressions(s) / ED Diagnoses   Final diagnoses:  Nonspecific chest pain  Rib pain  Hypokalemia    ED Discharge Orders        Ordered    methocarbamol (ROBAXIN) 500 MG tablet  At bedtime PRN     01/12/18 0223    naproxen (NAPROSYN) 375 MG tablet  2 times daily     01/12/18 0223       Georgiana Shore, PA-C 01/12/18 0243    Margarita Grizzle, MD 01/12/18 [phone]

## 2018-01-11 NOTE — ED Triage Notes (Signed)
Pt to ER for evaluation of central chest pain that radiates into back and into right arm pit x5 days. Pt in NAD. VSS. Denies nausea, vomiting, shortness of breath

## 2018-01-12 LAB — URINALYSIS, ROUTINE W REFLEX MICROSCOPIC
BACTERIA UA: NONE SEEN
BILIRUBIN URINE: NEGATIVE
Glucose, UA: NEGATIVE mg/dL
Ketones, ur: NEGATIVE mg/dL
LEUKOCYTES UA: NEGATIVE
NITRITE: NEGATIVE
PROTEIN: NEGATIVE mg/dL
Specific Gravity, Urine: 1.014 (ref 1.005–1.030)
pH: 6 (ref 5.0–8.0)

## 2018-01-12 MED ORDER — NAPROXEN 375 MG PO TABS: 375.0000 mg | ORAL_TABLET | Freq: Two times a day (BID) | ORAL | 0 refills | Status: DC

## 2018-01-12 MED ORDER — METHOCARBAMOL 500 MG PO TABS: 500.0000 mg | ORAL_TABLET | Freq: Every evening | ORAL | 0 refills | Status: DC | PRN

## 2018-01-12 NOTE — Discharge Instructions (Addendum)
As discussed, The medicine prescribed (robaxin) can help with muscle spasm but cannot be taken if driving, with alcohol or operating machinery. Take as needed at bedtime or every 8 hours as needed if not driving or working.  Follow up with your Primary care provider at the wellness center or a clinic of your choice if symptoms  persist beyond a week.  Your potassium was low today, try to increase your intake of leafy greens.  Return if worsening or new concerning symptoms in the meantime. Chest pressure, shortness of breath, nausea, vomiting or other new concerning symptoms in the meantime.

## 2018-07-16 ENCOUNTER — Other Ambulatory Visit: Payer: Self-pay

## 2018-07-16 ENCOUNTER — Ambulatory Visit (HOSPITAL_COMMUNITY)
Admission: EM | Admit: 2018-07-16 | Discharge: 2018-07-16 | Disposition: A | Discharge status: Home / Self Care | Payer: BLUE CROSS/BLUE SHIELD | Attending: Family Medicine | Admitting: Family Medicine

## 2018-07-16 ENCOUNTER — Encounter (HOSPITAL_COMMUNITY): Payer: Self-pay | Admitting: Emergency Medicine

## 2018-07-16 DIAGNOSIS — R42 Dizziness and giddiness: Secondary | ICD-10-CM

## 2018-07-16 MED ORDER — MECLIZINE HCL 25 MG PO TABS: 25.0000 mg | ORAL_TABLET | Freq: Three times a day (TID) | ORAL | 0 refills | Status: DC | PRN

## 2018-07-16 NOTE — ED Provider Notes (Signed)
[facility]   [id number] 07/16/18 Arrival Time: 1015  SUBJECTIVE:  Danille Demchak is a 52 y.o. female who presents with complaint of dizziness that began 3 days ago.  Reports losing balance at work, but did not fall or experience LOC.  Denies a precipitating event, trauma, or recent URI within the past month.  Was working in her normal capacity sewing.  Describes the dizziness as "the room spinning."  States that it is constant.  Has not tried OTC medication or modifications.  Symptoms made worse with walking.  Admits to previous symptoms 3-4 months that improved without treatment.  Complains of fatigue.  Denies fever, chills, nausea, vomiting, hearing changes, tinnitus, ear pain, chest pain, syncope, SOB, weakness, slurred speech, memory or emotional changes, facial drooping/ asymmetry, incoordination, numbness or tingling, abdominal pain, changes in bowel or bladder habits.    ROS: As per HPI. History reviewed. No pertinent past medical history. History reviewed. No pertinent surgical history. No Known Allergies No current facility-administered medications on file prior to encounter.    No current outpatient medications on file prior to encounter.   Social History   Socioeconomic History  . Marital status: Married    Spouse name: Not on file  . Number of children: Not on file  . Years of education: Not on file  . Highest education level: Not on file  Occupational History  . Not on file  Social Needs  . Financial resource strain: Not on file  . Food insecurity:    Worry: Not on file    Inability: Not on file  . Transportation needs:    Medical: Not on file    Non-medical: Not on file  Tobacco Use  . Smoking status: Never Smoker  . Smokeless tobacco: Never Used  Substance and Sexual Activity  . Alcohol use: No  . Drug use: No  . Sexual activity: Never    Birth control/protection: Condom  Lifestyle  . Physical activity:    Days per week: Not on file    Minutes per  session: Not on file  . Stress: Not on file  Relationships  . Social connections:    Talks on phone: Not on file    Gets together: Not on file    Attends religious service: Not on file    Active member of club or organization: Not on file    Attends meetings of clubs or organizations: Not on file    Relationship status: Not on file  . Intimate partner violence:    Fear of current or ex partner: Not on file    Emotionally abused: Not on file    Physically abused: Not on file    Forced sexual activity: Not on file  Other Topics Concern  . Not on file  Social History Narrative   No knowledge of biological parents.  From [place] to [place] in 2005.   No married   Children - 4 son and a daughter   History reviewed. No pertinent family history.  OBJECTIVE:  Vitals:   07/16/18 1027  BP: (!) 147/84  Pulse: 64  Resp: 18  Temp: 98.2 F (36.8 C)  SpO2: 100%    Orthostatic VS for the past 24 hrs:  BP- Lying Pulse- Lying BP- Sitting Pulse- Sitting  07/16/18 1027 147/84 62 (!) 141/91 62    General appearance: alert; no distress Eyes: PERRLA; EOMI; conjunctiva normal HENT: normocephalic; atraumatic; TMs normal; nasal mucosa normal; oral mucosa normal Hallpike: positive when head is turned to  the left Neck: supple with FROM Lungs: clear to auscultation bilaterally Heart: regular rate and rhythm Abdomen: soft, non-tender; bowel sounds normal Extremities: no cyanosis or edema; symmetrical with no gross deformities Skin: warm and dry Neurologic: normal gait; strength and sensation intact about the UEs and LEs; CN 2-12 grossly intact; finger to nose without difficulty; negative pronator drift Psychological: alert and cooperative; normal mood and affect  ASSESSMENT & PLAN:  1. Dizziness   2. Vertigo    Orthostatic VS were within normal limits Symptoms most likely due to vertigo Meclizine prescribed.  Take as directed for symptomatic relief.  This medication may make you drowsy so  use with cautions while driving or operating heavy machinery. Follow up with PCP or with community health and wellness if symptoms persists Return or go to the ER if you have any new or worsening symptoms  No orders of the defined types were placed in this encounter.  Reviewed expectations re: course of current medical issues. Questions answered. Outlined signs and symptoms indicating need for more acute intervention. Patient verbalized understanding. After Visit Summary given.    Rennis HardingWurst, Arihanna Estabrook, PA-C 07/16/18 1151

## 2018-07-16 NOTE — ED Triage Notes (Addendum)
Dizziness started 3 days ago.  Denies runny nose or cough.  Denies chest pain.  Patient says room is spinning  Sent from home from work.  Had a dizzy spell and someone caught her.  Patient did not fall and no loc

## 2018-07-16 NOTE — Discharge Instructions (Addendum)
Orthostatic VS were within normal limits Symptoms most likely due to vertigo Meclizine prescribed.  Take as directed for symptomatic relief.  This medication may make you drowsy so use with cautions while driving or operating heavy machinery. Follow up with PCP or with community health and wellness if symptoms persists Return or go to the ER if you have any new or worsening symptoms

## 2019-07-18 ENCOUNTER — Emergency Department (HOSPITAL_COMMUNITY): Payer: BLUE CROSS/BLUE SHIELD

## 2019-07-18 ENCOUNTER — Encounter (HOSPITAL_COMMUNITY): Payer: Self-pay

## 2019-07-18 ENCOUNTER — Other Ambulatory Visit: Payer: Self-pay

## 2019-07-18 ENCOUNTER — Emergency Department (HOSPITAL_COMMUNITY)
Admission: EM | Admit: 2019-07-18 | Discharge: 2019-07-18 | Disposition: A | Discharge status: Home / Self Care | Payer: BLUE CROSS/BLUE SHIELD | Attending: Emergency Medicine | Admitting: Emergency Medicine

## 2019-07-18 DIAGNOSIS — R42 Dizziness and giddiness: Secondary | ICD-10-CM | POA: Insufficient documentation

## 2019-07-18 DIAGNOSIS — Z20828 Contact with and (suspected) exposure to other viral communicable diseases: Secondary | ICD-10-CM | POA: Diagnosis not present

## 2019-07-18 LAB — CBC WITH DIFFERENTIAL/PLATELET
Abs Immature Granulocytes: 0.02 10*3/uL (ref 0.00–0.07)
Basophils Absolute: 0 10*3/uL (ref 0.0–0.1)
Basophils Relative: 0 %
Eosinophils Absolute: 0.1 10*3/uL (ref 0.0–0.5)
Eosinophils Relative: 1 %
HCT: 43.7 % (ref 36.0–46.0)
Hemoglobin: 14.2 g/dL (ref 12.0–15.0)
Immature Granulocytes: 0 %
Lymphocytes Relative: 36 %
Lymphs Abs: 3 10*3/uL (ref 0.7–4.0)
MCH: 29.6 pg (ref 26.0–34.0)
MCHC: 32.5 g/dL (ref 30.0–36.0)
MCV: 91.2 fL (ref 80.0–100.0)
Monocytes Absolute: 0.6 10*3/uL (ref 0.1–1.0)
Monocytes Relative: 7 %
Neutro Abs: 4.6 10*3/uL (ref 1.7–7.7)
Neutrophils Relative %: 56 %
Platelets: 279 10*3/uL (ref 150–400)
RBC: 4.79 MIL/uL (ref 3.87–5.11)
RDW: 12.2 % (ref 11.5–15.5)
WBC: 8.3 10*3/uL (ref 4.0–10.5)
nRBC: 0 % (ref 0.0–0.2)

## 2019-07-18 LAB — COMPREHENSIVE METABOLIC PANEL
ALT: 26 U/L (ref 0–44)
AST: 24 U/L (ref 15–41)
Albumin: 4.6 g/dL (ref 3.5–5.0)
Alkaline Phosphatase: 86 U/L (ref 38–126)
Anion gap: 10 (ref 5–15)
BUN: 24 mg/dL — ABNORMAL HIGH (ref 6–20)
CO2: 25 mmol/L (ref 22–32)
Calcium: 9.3 mg/dL (ref 8.9–10.3)
Chloride: 106 mmol/L (ref 98–111)
Creatinine, Ser: 0.58 mg/dL (ref 0.44–1.00)
GFR calc Af Amer: >60 mL/min (ref >60)
GFR calc non Af Amer: >60 mL/min (ref >60)
Glucose, Bld: 93 mg/dL (ref 70–99)
Potassium: 3.7 mmol/L (ref 3.5–5.1)
Sodium: 141 mmol/L (ref 135–145)
Total Bilirubin: 0.9 mg/dL (ref 0.3–1.2)
Total Protein: 9.1 g/dL — ABNORMAL HIGH (ref 6.5–8.1)

## 2019-07-18 LAB — URINALYSIS, ROUTINE W REFLEX MICROSCOPIC
Bacteria, UA: NONE SEEN
Bilirubin Urine: NEGATIVE
Glucose, UA: NEGATIVE mg/dL
Ketones, ur: NEGATIVE mg/dL
Nitrite: NEGATIVE
Protein, ur: NEGATIVE mg/dL
Specific Gravity, Urine: 1.025 (ref 1.005–1.030)
pH: 5 (ref 5.0–8.0)

## 2019-07-18 LAB — SARS CORONAVIRUS 2 BY RT PCR (HOSPITAL ORDER, PERFORMED IN ~~LOC~~ HOSPITAL LAB): SARS Coronavirus 2: NEGATIVE

## 2019-07-18 MED ORDER — MECLIZINE HCL 25 MG PO TABS: 25.0000 mg | ORAL_TABLET | Freq: Three times a day (TID) | ORAL | 0 refills | Status: DC | PRN

## 2019-07-18 MED ORDER — MECLIZINE HCL 25 MG PO TABS
25.0000 mg | ORAL_TABLET | Freq: Once | ORAL | Status: AC
  Administered 2019-07-18: 25 mg via ORAL
  Filled 2019-07-18: qty 1

## 2019-07-18 MED ORDER — SODIUM CHLORIDE 0.9 % IV BOLUS
1000.0000 mL | Freq: Once | INTRAVENOUS | Status: AC
  Administered 2019-07-18: 1000 mL via INTRAVENOUS

## 2019-07-18 NOTE — ED Triage Notes (Signed)
Patient arrived via POV with son at bedside. Patietn is AOx4 and ambulatory however does not speak english, patient speaks vietnamese. Patient son states that symptoms have been getting worse over the course of 7-10 days. Abd pain, dizziness, fever, SOB.

## 2019-07-18 NOTE — ED Notes (Addendum)
Pt ambulated around room and was steady on her feet. Pt reports not feeling dizzy and wants to go home

## 2019-07-18 NOTE — ED Notes (Signed)
Pt aware of need for urine specimen, but unable to provide one at this time. Bedside commode placed at bedside.

## 2019-07-18 NOTE — ED Provider Notes (Signed)
[facility] DEPT Provider Note   CSN: [id number] Arrival date & time: 07/18/19  1404    History   Chief Complaint Chief Complaint  Patient presents with  . Dizziness  . Possible Covid Exposure    HPI Barbara Hanson is a 53 y.o. female.     HPI Patient with known COVID exposure while at work.  States she has had 7-10 days of worsening diarrhea, nonproductive cough, shortness of breath, fever, chills, dizziness.  Currently denying any specific pain.  No blood in the stool. History reviewed. No pertinent past medical history.  Patient Active Problem List   Diagnosis Date Noted  . Elevated cholesterol 11/06/2016    History reviewed. No pertinent surgical history.   OB History   No obstetric history on file.      Home Medications    Prior to Admission medications   Medication Sig Start Date End Date Taking? Authorizing Provider  acetaminophen (TYLENOL) 325 MG tablet Take 325 mg by mouth daily as needed for headache.   Yes [provider]  meclizine (ANTIVERT) 25 MG tablet Take 1 tablet (25 mg total) by mouth 3 (three) times daily as needed for dizziness. 07/18/19   Julianne Rice, MD    Family History History reviewed. No pertinent family history.  Social History Social History   Tobacco Use  . Smoking status: Never Smoker  . Smokeless tobacco: Never Used  Substance Use Topics  . Alcohol use: No  . Drug use: No     Allergies   Patient has no known allergies.   Review of Systems Review of Systems  Constitutional: Positive for chills, fatigue and fever.  Respiratory: Positive for cough and shortness of breath.   Cardiovascular: Negative for chest pain.  Gastrointestinal: Positive for diarrhea. Negative for abdominal pain, constipation, nausea and vomiting.  Genitourinary: Negative for dysuria, flank pain and frequency.  Musculoskeletal: Negative for back pain, myalgias and neck pain.  Skin: Negative for rash and  wound.  Neurological: Positive for dizziness. Negative for weakness, light-headedness, numbness and headaches.     Physical Exam Updated Vital Signs BP 114/78   Pulse 79   Temp 98.3 F (36.8 C) (Oral)   Resp 18   Ht 5\' 3"  (1.6 m)   Wt 54 kg   LMP 06/13/2012   SpO2 96%   BMI 21.09 kg/m   Physical Exam Vitals signs and nursing note reviewed.  Constitutional:      General: She is not in acute distress.    Appearance: Normal appearance. She is well-developed. She is not ill-appearing.  HENT:     Head: Normocephalic and atraumatic.     Nose: Nose normal.     Mouth/Throat:     Mouth: Mucous membranes are moist.     Pharynx: No oropharyngeal exudate or posterior oropharyngeal erythema.  Eyes:     Extraocular Movements: Extraocular movements intact.     Pupils: Pupils are equal, round, and reactive to light.  Neck:     Musculoskeletal: Normal range of motion and neck supple. No neck rigidity or muscular tenderness.  Cardiovascular:     Rate and Rhythm: Normal rate and regular rhythm.     Heart sounds: No murmur. No friction rub. No gallop.   Pulmonary:     Effort: Pulmonary effort is normal. No respiratory distress.     Breath sounds: Normal breath sounds. No stridor. No wheezing, rhonchi or rales.  Chest:     Chest wall: No tenderness.  Abdominal:  General: Bowel sounds are normal. There is no distension.     Palpations: Abdomen is soft. There is no mass.     Tenderness: There is no abdominal tenderness. There is no right CVA tenderness, left CVA tenderness, guarding or rebound.     Hernia: No hernia is present.  Musculoskeletal: Normal range of motion.        General: No swelling, tenderness, deformity or signs of injury.     Right lower leg: No edema.     Left lower leg: No edema.  Lymphadenopathy:     Cervical: No cervical adenopathy.  Skin:    General: Skin is warm and dry.     Capillary Refill: Capillary refill takes less than 2 seconds.     Findings: No  erythema or rash.  Neurological:     General: No focal deficit present.     Mental Status: She is alert and oriented to person, place, and time.  Psychiatric:        Behavior: Behavior normal.      ED Treatments / Results  Labs (all labs ordered are listed, but only abnormal results are displayed) Labs Reviewed  COMPREHENSIVE METABOLIC PANEL - Abnormal; Notable for the following components:      Result Value   BUN 24 (*)    Total Protein 9.1 (*)    All other components within normal limits  URINALYSIS, ROUTINE W REFLEX MICROSCOPIC - Abnormal; Notable for the following components:   Hgb urine dipstick SMALL (*)    Leukocytes,Ua SMALL (*)    All other components within normal limits  SARS CORONAVIRUS 2 (HOSPITAL ORDER, PERFORMED IN [facility])  CBC WITH DIFFERENTIAL/PLATELET    EKG EKG Interpretation  Date/Time:  Saturday July 18 2019 14:36:31 EDT Ventricular Rate:  75 PR Interval:    QRS Duration: 100 QT Interval:  420 QTC Calculation: 470 R Axis:   77 Text Interpretation:  Sinus rhythm Confirmed by Loren RacerYelverton, Garreth Burnsworth ([id number]) on 07/18/2019 6:48:30 PM   Radiology Dg Chest Port 1 View  Result Date: 07/18/2019 CLINICAL DATA:  Cough. Fever. Shortness of breath. Abdominal pain. EXAM: PORTABLE CHEST 1 VIEW COMPARISON:  01/11/2018 FINDINGS: The heart size and mediastinal contours are within normal limits. Both lungs are clear. The visualized skeletal structures are unremarkable. IMPRESSION: No active disease. Electronically Signed   By: Francene BoyersJames  Maxwell M.D.   On: 07/18/2019 16:05    Procedures Procedures (including critical care time)  Medications Ordered in ED Medications  sodium chloride 0.9 % bolus 1,000 mL (0 mLs Intravenous Stopped 07/18/19 1747)  meclizine (ANTIVERT) tablet 25 mg (25 mg Oral Given 07/18/19 1744)     Initial Impression / Assessment and Plan / ED Course  I have reviewed the triage vital signs and the nursing notes.  Pertinent labs &  imaging results that were available during my care of the patient were reviewed by me and considered in my medical decision making (see chart for details).        Coronavirus testing is negative. Likely has URI. Suspect dizziness may be related to ongoing vertigo. States she is seeing an ENT in the past.  No neurologic findings at this point.  Will treat symptomatically.  Advised to follow-up with ENT as an outpatient.  Return precautions given.  Final Clinical Impressions(s) / ED Diagnoses   Final diagnoses:  Dizziness    ED Discharge Orders         Ordered    meclizine (ANTIVERT) 25 MG tablet  3 times daily PRN     07/18/19 1847           Loren RacerYelverton, Duwan Adrian, MD 07/18/19 1850

## 2019-07-27 ENCOUNTER — Emergency Department (HOSPITAL_COMMUNITY): Payer: BLUE CROSS/BLUE SHIELD

## 2019-07-27 ENCOUNTER — Encounter (HOSPITAL_COMMUNITY): Payer: Self-pay | Admitting: Emergency Medicine

## 2019-07-27 ENCOUNTER — Emergency Department (HOSPITAL_COMMUNITY)
Admission: EM | Admit: 2019-07-27 | Discharge: 2019-07-27 | Disposition: A | Discharge status: Home / Self Care | Payer: BLUE CROSS/BLUE SHIELD | Attending: Emergency Medicine | Admitting: Emergency Medicine

## 2019-07-27 ENCOUNTER — Other Ambulatory Visit: Payer: Self-pay

## 2019-07-27 DIAGNOSIS — R05 Cough: Secondary | ICD-10-CM | POA: Diagnosis present

## 2019-07-27 DIAGNOSIS — U071 COVID-19: Secondary | ICD-10-CM | POA: Diagnosis not present

## 2019-07-27 MED ORDER — BENZONATATE 100 MG PO CAPS: 100.0000 mg | ORAL_CAPSULE | Freq: Three times a day (TID) | ORAL | 0 refills | Status: DC

## 2019-07-27 NOTE — Discharge Instructions (Signed)
[facility] Under Monitoring [facility]  Location[place] [address] [place] [id number]   Infection Prevention Recommendations for Individuals Confirmed to have, or Being Evaluated for, 2019 Novel Coronavirus (COVID-19) Infection Who Receive Care at Home  Individuals who are confirmed to have, or are being evaluated for, COVID-19 should follow the prevention steps below until a healthcare provider or local or state health department says they can return to normal activities.  Stay home except to get medical care You should restrict activities outside your home, except for getting medical care. Do not go to work, school, or public areas, and do not use public transportation or taxis.  Call ahead before visiting your doctor Before your medical appointment, call the healthcare provider and tell them that you have, or are being evaluated for, COVID-19 infection. This will help the healthcare providers office take steps to keep other people from getting infected. Ask your healthcare provider to call the local or state health department.  Monitor your symptoms Seek prompt medical attention if your illness is worsening (e.g., difficulty breathing). Before going to your medical appointment, call the healthcare provider and tell them that you have, or are being evaluated for, COVID-19 infection. Ask your healthcare provider to call the local or state health department.  Wear a facemask You should wear a facemask that covers your nose and mouth when you are in the same room with other people and when you visit a healthcare provider. People who live with or visit you should also wear a facemask while they are in the same room with you.  Separate yourself from other people in your home As much as possible, you should stay in a different room from other people in your home. Also, you should use a separate bathroom, if available.  Avoid sharing household items You should not share  dishes, drinking glasses, cups, eating utensils, towels, bedding, or other items with other people in your home. After using these items, you should wash them thoroughly with soap and water.  Cover your coughs and sneezes Cover your mouth and nose with a tissue when you cough or sneeze, or you can cough or sneeze into your sleeve. Throw used tissues in a lined trash can, and immediately wash your hands with soap and water for at least 20 seconds or use an alcohol-based hand rub.  Wash your Union Pacific Corporationhands Wash your hands often and thoroughly with soap and water for at least 20 seconds. You can use an alcohol-based hand sanitizer if soap and water are not available and if your hands are not visibly dirty. Avoid touching your eyes, nose, and mouth with unwashed hands.   Prevention Steps for Caregivers and Household Members of Individuals Confirmed to have, or Being Evaluated for, COVID-19 Infection Being Cared for in the Home  If you live with, or provide care at home for, a person confirmed to have, or being evaluated for, COVID-19 infection please follow these guidelines to prevent infection:  Follow healthcare providers instructions Make sure that you understand and can help the patient follow any healthcare provider instructions for all care.  Provide for the patients basic needs You should help the patient with basic needs in the home and provide support for getting groceries, prescriptions, and other personal needs.  Monitor the patients symptoms If they are getting sicker, call his or her medical provider and tell them that the patient has, or is being evaluated for, COVID-19 infection. This will help the healthcare providers office take  steps to keep other people from getting infected. Ask the healthcare provider to call the local or state health department.  Limit the number of people who have contact with the patient If possible, have only one caregiver for the patient. Other  household members should stay in another home or place of residence. If this is not possible, they should stay in another room, or be separated from the patient as much as possible. Use a separate bathroom, if available. Restrict visitors who do not have an essential need to be in the home.  Keep older adults, very young children, and other sick people away from the patient Keep older adults, very young children, and those who have compromised immune systems or chronic health conditions away from the patient. This includes people with chronic heart, lung, or kidney conditions, diabetes, and cancer.  Ensure good ventilation Make sure that shared spaces in the home have good air flow, such as from an air conditioner or an opened window, weather permitting.  Wash your hands often Wash your hands often and thoroughly with soap and water for at least 20 seconds. You can use an alcohol based hand sanitizer if soap and water are not available and if your hands are not visibly dirty. Avoid touching your eyes, nose, and mouth with unwashed hands. Use disposable paper towels to dry your hands. If not available, use dedicated cloth towels and replace them when they become wet.  Wear a facemask and gloves Wear a disposable facemask at all times in the room and gloves when you touch or have contact with the patients blood, body fluids, and/or secretions or excretions, such as sweat, saliva, sputum, nasal mucus, vomit, urine, or feces.  Ensure the mask fits over your nose and mouth tightly, and do not touch it during use. Throw out disposable facemasks and gloves after using them. Do not reuse. Wash your hands immediately after removing your facemask and gloves. If your personal clothing becomes contaminated, carefully remove clothing and launder. Wash your hands after handling contaminated clothing. Place all used disposable facemasks, gloves, and other waste in a lined container before disposing them with  other household waste. Remove gloves and wash your hands immediately after handling these items.  Do not share dishes, glasses, or other household items with the patient Avoid sharing household items. You should not share dishes, drinking glasses, cups, eating utensils, towels, bedding, or other items with a patient who is confirmed to have, or being evaluated for, COVID-19 infection. After the person uses these items, you should wash them thoroughly with soap and water.  Wash laundry thoroughly Immediately remove and wash clothes or bedding that have blood, body fluids, and/or secretions or excretions, such as sweat, saliva, sputum, nasal mucus, vomit, urine, or feces, on them. Wear gloves when handling laundry from the patient. Read and follow directions on labels of laundry or clothing items and detergent. In general, wash and dry with the warmest temperatures recommended on the label.  Clean all areas the individual has used often Clean all touchable surfaces, such as counters, tabletops, doorknobs, bathroom fixtures, toilets, phones, keyboards, tablets, and bedside tables, every day. Also, clean any surfaces that may have blood, body fluids, and/or secretions or excretions on them. Wear gloves when cleaning surfaces the patient has come in contact with. Use a diluted bleach solution (e.g., dilute bleach with 1 part bleach and 10 parts water) or a household disinfectant with a label that says EPA-registered for coronaviruses. To make a bleach solution  at home, add 1 tablespoon of bleach to 1 quart (4 cups) of water. For a larger supply, add  cup of bleach to 1 gallon (16 cups) of water. Read labels of cleaning products and follow recommendations provided on product labels. Labels contain instructions for safe and effective use of the cleaning product including precautions you should take when applying the product, such as wearing gloves or eye protection and making sure you have good ventilation  during use of the product. Remove gloves and wash hands immediately after cleaning.  Monitor yourself for signs and symptoms of illness Caregivers and household members are considered close contacts, should monitor their health, and will be asked to limit movement outside of the home to the extent possible. Follow the monitoring steps for close contacts listed on the symptom monitoring form.   ? If you have additional questions, contact your local health department or call the epidemiologist on call at [phone] (available 24/7). ? This guidance is subject to change. For the most up-to-date guidance from [facility]), please refer to their website: YouBlogs.pl   Please stay at home for the next 2 weeks.  You should consider yourself contagious to others.  Your chest x-ray was normal, you likely do have coronavirus.  Tylenol for fever, Tessalon for cough

## 2019-07-27 NOTE — ED Notes (Signed)
ED Provider at bedside. 

## 2019-07-27 NOTE — ED Provider Notes (Signed)
[facility] EMERGENCY DEPARTMENT Provider Note   CSN: [id number] Arrival date & time: 07/27/19  1962    History   Chief Complaint Chief Complaint  Patient presents with  . Cough  . No Smell  . Sore Throat  . Abdominal Pain    HPI Barbara Hanson is a 53 y.o. female.     HPI  The patient is a 53 year old female, she has a history of some elevated cholesterol but no other chronic medical conditions.  She presents approximately 1 week after previously being seen in the emergency department for complaint of having some dizziness, she had diarrhea, a nonproductive cough, shortness of breath, fevers and chills.  She reports that she tested negative which I have confirmed through the medical record that she did not test positive for coronavirus at that time.  Currently the patient continues to have some issues with not having any taste when she eats, continuing to have some fatigue, occasional cough and diarrhea, as well as having a sore throat occasionally.  She reports that 3 of her children have been having similar symptoms and all of them tested positive for coronavirus.  Her significant other has also had similar symptoms.  She reports that she has not gotten any worse but continues to have symptoms.  History reviewed. No pertinent past medical history.  Patient Active Problem List   Diagnosis Date Noted  . Elevated cholesterol 11/06/2016    History reviewed. No pertinent surgical history.   OB History   No obstetric history on file.      Home Medications    Prior to Admission medications   Medication Sig Start Date End Date Taking? Authorizing Provider  acetaminophen (TYLENOL) 325 MG tablet Take 325 mg by mouth daily as needed for headache.    [provider]  benzonatate (TESSALON) 100 MG capsule Take 1 capsule (100 mg total) by mouth every 8 (eight) hours. 07/27/19   Noemi Chapel, MD  meclizine (ANTIVERT) 25 MG tablet Take 1 tablet (25 mg total) by  mouth 3 (three) times daily as needed for dizziness. 07/18/19   Julianne Rice, MD    Family History History reviewed. No pertinent family history.  Social History Social History   Tobacco Use  . Smoking status: Never Smoker  . Smokeless tobacco: Never Used  Substance Use Topics  . Alcohol use: No  . Drug use: No     Allergies   Patient has no known allergies.   Review of Systems Review of Systems  All other systems reviewed and are negative.    Physical Exam Updated Vital Signs BP 129/78   Pulse 74   Temp 98.2 F (36.8 C) (Oral)   Resp 17   LMP 06/13/2012   SpO2 98%   Physical Exam Vitals signs and nursing note reviewed.  Constitutional:      General: She is not in acute distress.    Appearance: She is well-developed.  HENT:     Head: Normocephalic and atraumatic.     Mouth/Throat:     Pharynx: No oropharyngeal exudate.  Eyes:     General: No scleral icterus.       Right eye: No discharge.        Left eye: No discharge.     Conjunctiva/sclera: Conjunctivae normal.     Pupils: Pupils are equal, round, and reactive to light.  Neck:     Musculoskeletal: Normal range of motion and neck supple.     Thyroid: No thyromegaly.  Vascular: No JVD.  Cardiovascular:     Rate and Rhythm: Normal rate and regular rhythm.     Heart sounds: Normal heart sounds. No murmur. No friction rub. No gallop.   Pulmonary:     Effort: Pulmonary effort is normal. No respiratory distress.     Breath sounds: Normal breath sounds. No wheezing or rales.  Abdominal:     General: Bowel sounds are normal. There is no distension.     Palpations: Abdomen is soft. There is no mass.     Tenderness: There is no abdominal tenderness.  Musculoskeletal: Normal range of motion.        General: No tenderness.  Lymphadenopathy:     Cervical: No cervical adenopathy.  Skin:    General: Skin is warm and dry.     Findings: No erythema or rash.  Neurological:     Mental Status: She is  alert.     Coordination: Coordination normal.  Psychiatric:        Behavior: Behavior normal.      ED Treatments / Results  Labs (all labs ordered are listed, but only abnormal results are displayed) Labs Reviewed - No data to display  EKG None  [facility]  Result Date: 07/27/2019 CLINICAL DATA:  Cough, COVID-19 exposure EXAM: PORTABLE CHEST 1 VIEW COMPARISON:  07/18/2019 FINDINGS: Cardiomegaly. Both lungs are clear. The visualized skeletal structures are unremarkable. IMPRESSION: Cardiomegaly without acute abnormality of the lungs. No airspace opacity. Electronically Signed   By: Lauralyn PrimesAlex  Bibbey M.D.   On: 07/27/2019 09:45    Procedures Procedures (including critical care time)  Medications Ordered in ED Medications - No data to display   Initial Impression / Assessment and Plan / ED Course  I have reviewed the triage vital signs and the nursing notes.  Pertinent labs & imaging results that were available during my care of the patient were reviewed by me and considered in my medical decision making (see chart for details).       The patient's exam is actually quite unremarkable with a very clear pharynx with no signs of exudate asymmetry or hypertrophy.  She does not have a fever or hypoxia and she is not tachycardic.  Her lung sounds are clear without rales or wheezing.  Given that she has 3 exposures at home and her children who have had similar symptoms in all tested positive for coronavirus I suspect that she has the same and testing is not necessary.  I have discussed with the patient in the presence of the interpreter not only her history and confirmed historical findings as well as physical exam but have also used the interpreter to discuss with the patient the need to quarantine at home including her whole family to avoid spreading this through the community.  She has expressed her understanding.  Chest X ray will be obtained to rule out any  complicating factors such as pneumothorax, focal pneumonia or other abnormalities.  The patient appears well and is stable at this time  Chest x-ray without any acute findings to suggest airspace opacity or lung disease.  Vitals again reviewed and are normal, patient informed stable for discharge  Final Clinical Impressions(s) / ED Diagnoses   Final diagnoses:  COVID-19 virus infection    ED Discharge Orders         Ordered    benzonatate (TESSALON) 100 MG capsule  Every 8 hours     07/27/19 1027  Eber HongMiller, Latresha Yahr, MD 07/27/19 1027

## 2019-07-27 NOTE — ED Triage Notes (Signed)
Pt here for evaluation of sore throat, cough, and loss of smell and taste.  Pt denies any pain. Pt A/O, ambulatory, Pt speaks very little [place].

## 2019-07-27 NOTE — ED Notes (Signed)
Patient verbalizes understanding of discharge instructions. Opportunity for questioning and answers were provided. Armband removed by staff, pt discharged from ED.  

## 2019-08-16 IMAGING — CR DG CHEST 2V
2 series · 2 of 2 positions shown · non-contrast
Comparison: 11/06/2016

CLINICAL DATA: Chest pain

EXAM:
CHEST  2 VIEW

[chest lat]
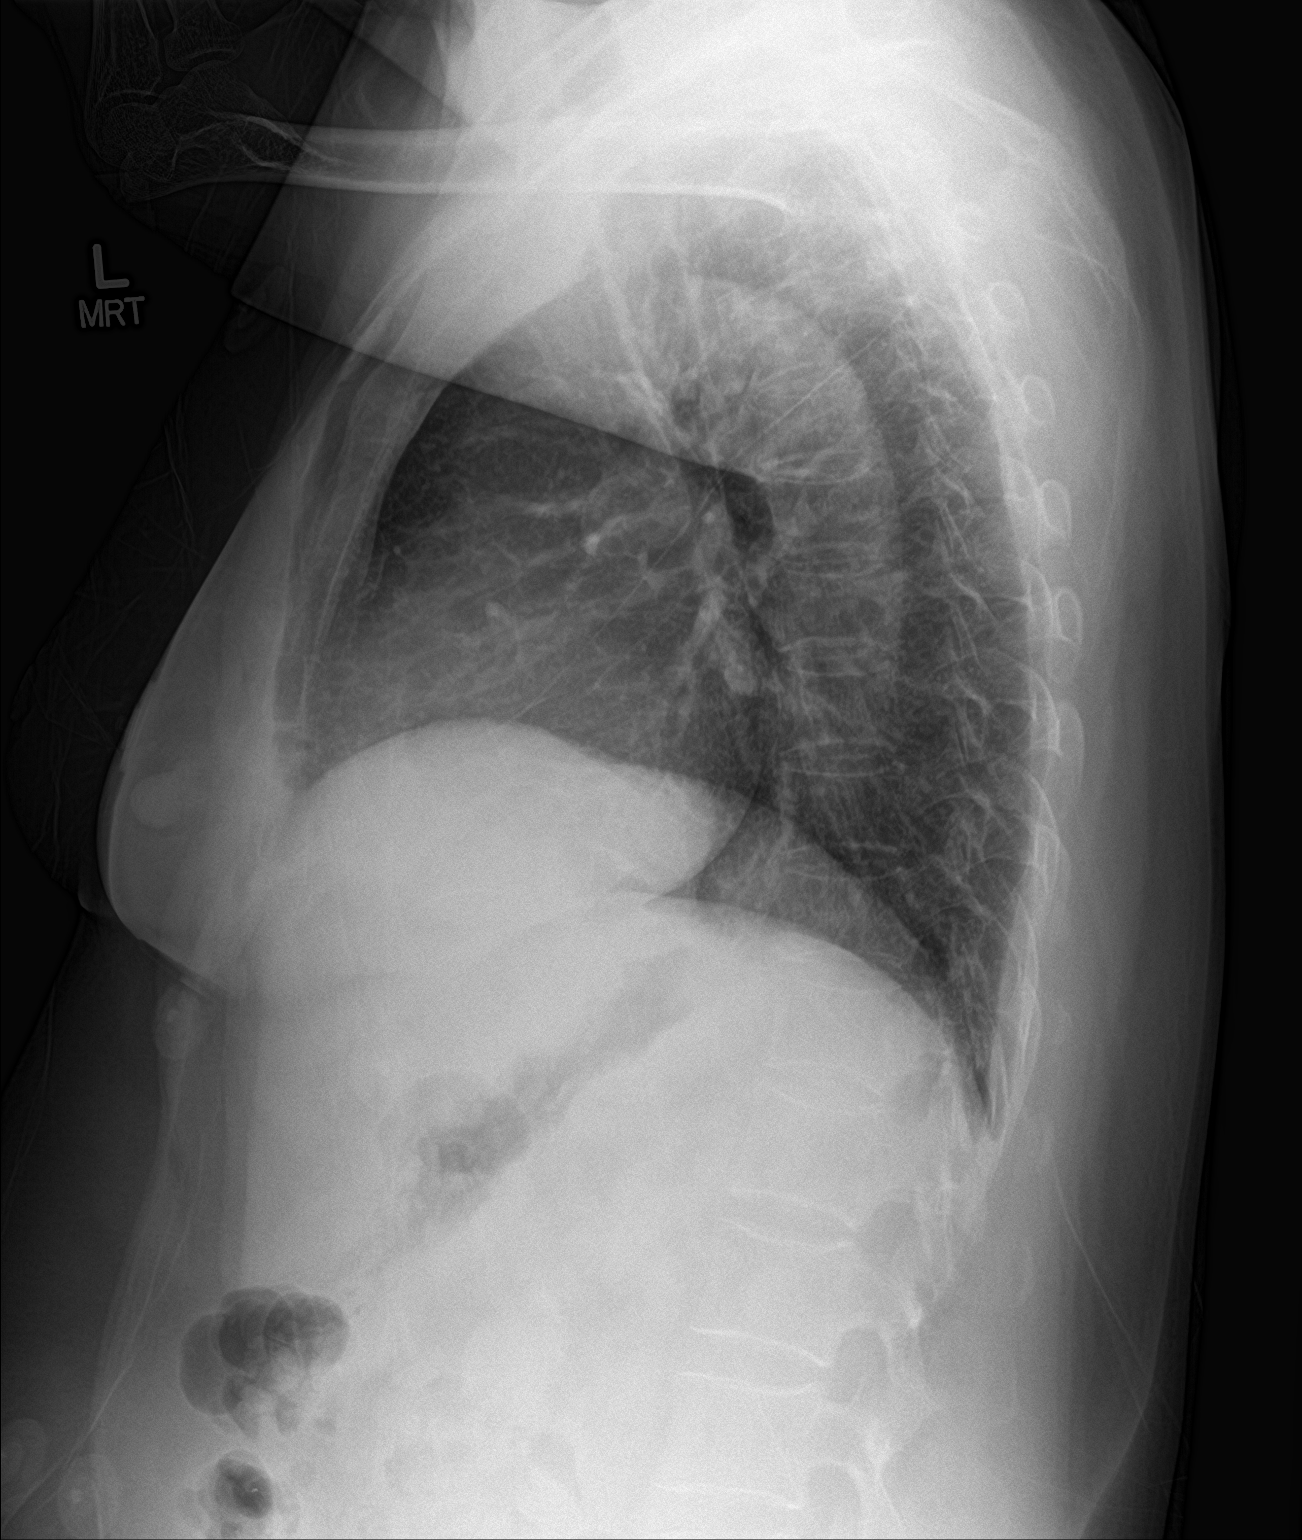

[chest ap]
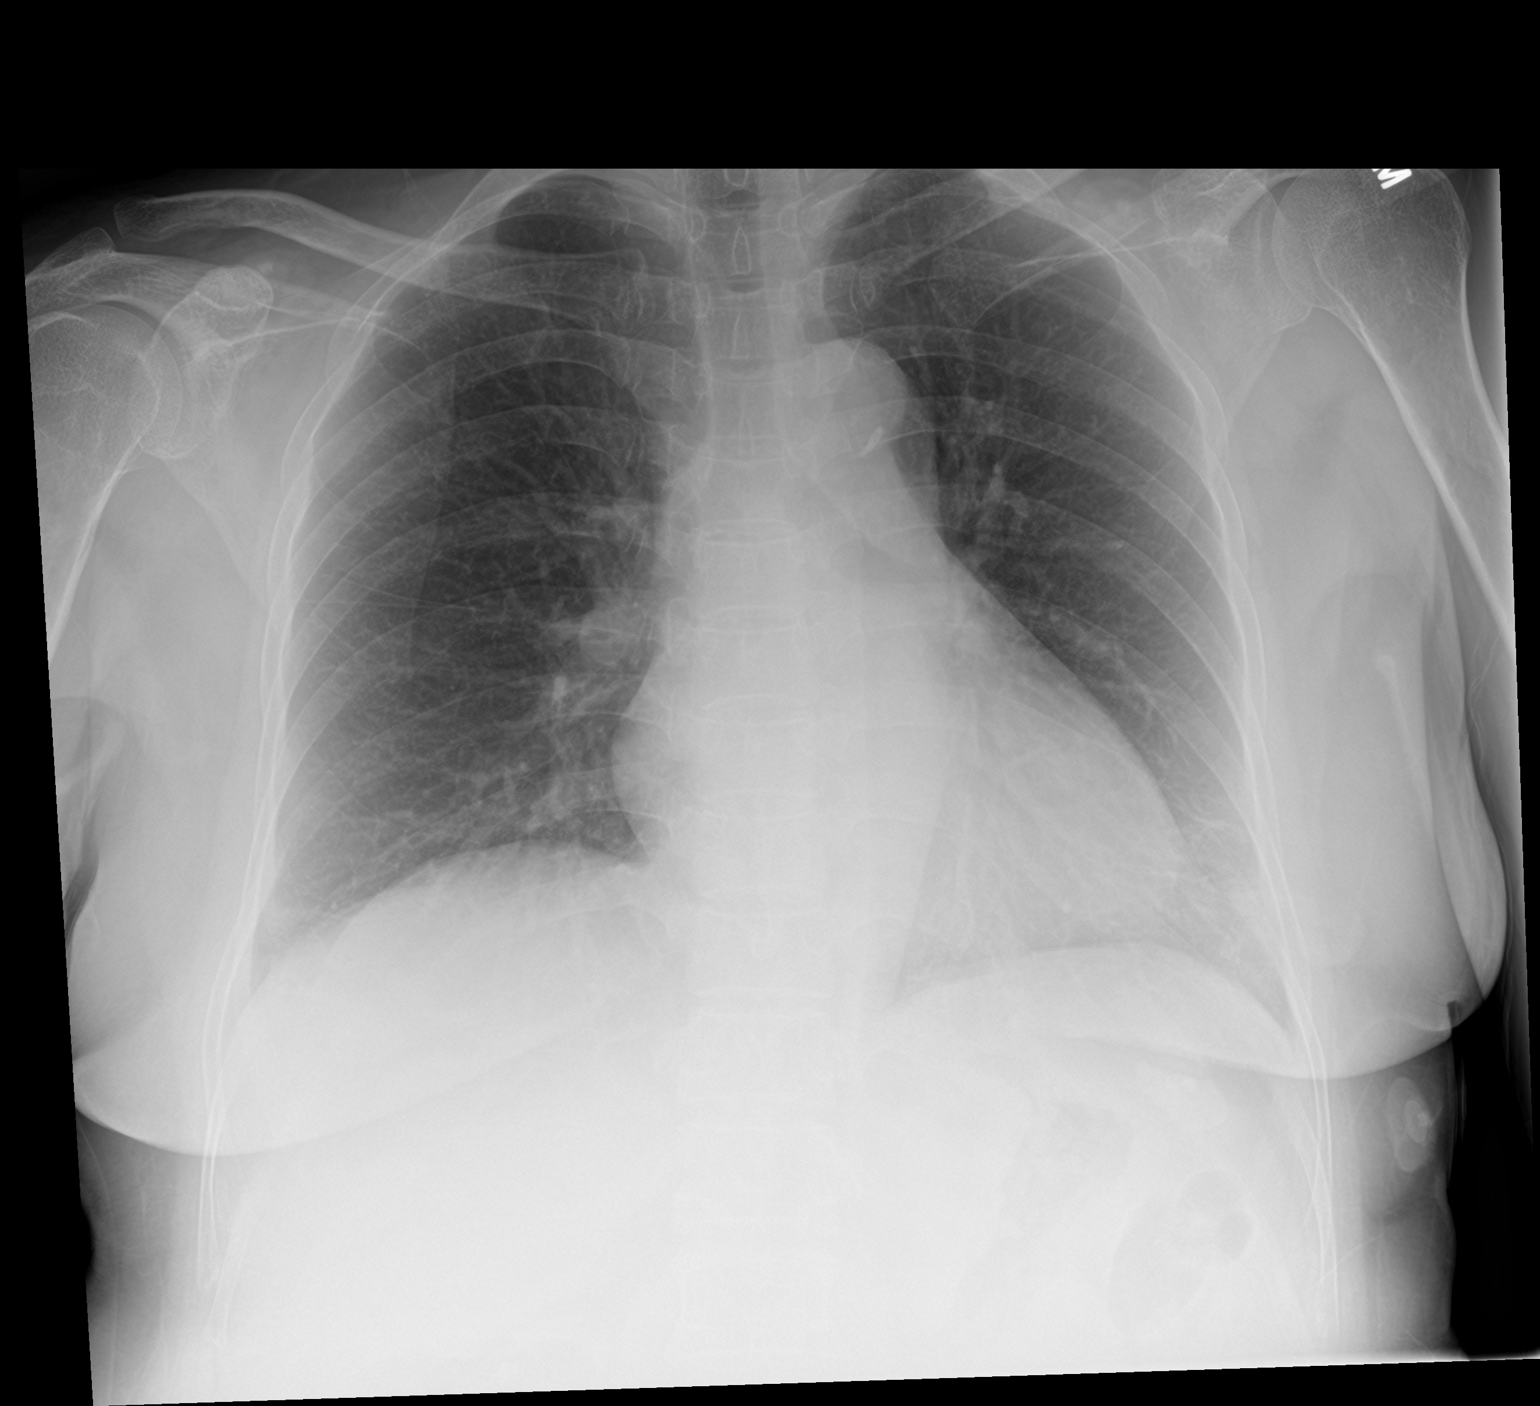

[2 of 2 positions shown; findings below may reference images not displayed]

FINDINGS: Borderline heart size. Stable mediastinal contours. There is no
edema, consolidation, effusion, or pneumothorax. Possible minimal
atelectasis seen along the lingula.
IMPRESSION: Borderline heart size.  No edema or pneumonia.

## 2019-08-21 ENCOUNTER — Other Ambulatory Visit: Payer: Self-pay

## 2019-08-21 ENCOUNTER — Ambulatory Visit (HOSPITAL_COMMUNITY)
Admission: EM | Admit: 2019-08-21 | Discharge: 2019-08-21 | Disposition: A | Discharge status: Home / Self Care | Payer: BLUE CROSS/BLUE SHIELD | Attending: Family Medicine | Admitting: Family Medicine

## 2019-08-21 ENCOUNTER — Encounter (HOSPITAL_COMMUNITY): Payer: Self-pay | Admitting: Emergency Medicine

## 2019-08-21 DIAGNOSIS — R05 Cough: Secondary | ICD-10-CM | POA: Diagnosis not present

## 2019-08-21 DIAGNOSIS — Z20828 Contact with and (suspected) exposure to other viral communicable diseases: Secondary | ICD-10-CM | POA: Insufficient documentation

## 2019-08-21 DIAGNOSIS — R059 Cough, unspecified: Secondary | ICD-10-CM

## 2019-08-21 NOTE — ED Triage Notes (Signed)
Pt here for cough and not feeling better x 1 month; pt seen x 2 for same

## 2019-08-21 NOTE — Discharge Instructions (Signed)
°  No Primary Care Doctor: °Call [facility] at  [phone] - they can help you locate a primary care doctor that  accepts your insurance, provides certain services, etc. °Physician Referral Service- [phone] ° ° ° °

## 2019-08-21 NOTE — ED Provider Notes (Signed)
[facility]    CSN: [id number] Arrival date & time: 08/21/19  [id number]      History   Chief Complaint Chief Complaint  Patient presents with  . Cough    HPI Barbara Hanson is a 53 y.o. female.   HPI  Barbara Hanson is a 53 y.o. female presenting to UC with c/o mild gradually improving but still present cough for about 1 month.  She has tested negative for Covid-19 but states some family members have tested positive.  Pt denies fever, chills, n/v/d. Denies congestion, sore throat, chest pain or SOB.  She has taken the prescribed tessalon with moderate relief. Pt requesting work note for today.   History reviewed. No pertinent past medical history.  Patient Active Problem List   Diagnosis Date Noted  . Elevated cholesterol 11/06/2016    History reviewed. No pertinent surgical history.  OB History   No obstetric history on file.      Home Medications    Prior to Admission medications   Medication Sig Start Date End Date Taking? Authorizing Provider  acetaminophen (TYLENOL) 325 MG tablet Take 325 mg by mouth daily as needed for headache.    [provider]  benzonatate (TESSALON) 100 MG capsule Take 1 capsule (100 mg total) by mouth every 8 (eight) hours. 07/27/19   Eber HongMiller, Brian, MD  meclizine (ANTIVERT) 25 MG tablet Take 1 tablet (25 mg total) by mouth 3 (three) times daily as needed for dizziness. 07/18/19   Loren RacerYelverton, David, MD    Family History History reviewed. No pertinent family history.  Social History Social History   Tobacco Use  . Smoking status: Never Smoker  . Smokeless tobacco: Never Used  Substance Use Topics  . Alcohol use: No  . Drug use: No     Allergies   Patient has no known allergies.   Review of Systems Review of Systems  Constitutional: Negative for chills and fever.  HENT: Negative for congestion, ear pain, sore throat, trouble swallowing and voice change.   Respiratory: Positive for cough. Negative for chest tightness,  shortness of breath and wheezing.   Cardiovascular: Negative for chest pain and palpitations.  Gastrointestinal: Negative for abdominal pain, diarrhea, nausea and vomiting.  Musculoskeletal: Negative for arthralgias, back pain and myalgias.  Skin: Negative for rash.  Neurological: Negative for dizziness, light-headedness and headaches.     Physical Exam Triage Vital Signs ED Triage Vitals  Enc Vitals Group     BP 08/21/19 1007 (!) 156/83     Pulse Rate 08/21/19 1007 73     Resp 08/21/19 1007 16     Temp 08/21/19 1007 98.4 F (36.9 C)     Temp Source 08/21/19 1007 Tympanic     SpO2 08/21/19 1007 99 %     Weight --      Height --      Head Circumference --      Peak Flow --      Pain Score 08/21/19 1016 0     Pain Loc --      Pain Edu? --      Excl. in GC? --    No data found.  Updated Vital Signs BP (!) 156/83 (BP Location: Left Arm)   Pulse 73   Temp 98.4 F (36.9 C) (Tympanic)   Resp 16   LMP 06/13/2012   SpO2 99%   Visual Acuity Right Eye Distance:   Left Eye Distance:   Bilateral Distance:    Right Eye Near:  Left Eye Near:    Bilateral Near:     Physical Exam Vitals signs and nursing note reviewed.  Constitutional:      Appearance: Normal appearance. She is well-developed.  HENT:     Head: Normocephalic and atraumatic.     Right Ear: Tympanic membrane normal.     Left Ear: Tympanic membrane normal.     Nose: Nose normal.     Right Sinus: No maxillary sinus tenderness or frontal sinus tenderness.     Left Sinus: No maxillary sinus tenderness or frontal sinus tenderness.     Mouth/Throat:     Lips: Pink.     Mouth: Mucous membranes are moist.     Pharynx: Oropharynx is clear. Uvula midline.  Neck:     Musculoskeletal: Normal range of motion.  Cardiovascular:     Rate and Rhythm: Normal rate and regular rhythm.  Pulmonary:     Effort: Pulmonary effort is normal. No respiratory distress.     Breath sounds: Normal breath sounds. No stridor. No  wheezing, rhonchi or rales.  Musculoskeletal: Normal range of motion.  Skin:    General: Skin is warm and dry.  Neurological:     Mental Status: She is alert and oriented to person, place, and time.  Psychiatric:        Behavior: Behavior normal.      UC Treatments / Results  Labs (all labs ordered are listed, but only abnormal results are displayed) Labs Reviewed  NOVEL CORONAVIRUS, NAA (HOSP ORDER, SEND-OUT TO REF LAB; TAT 18-24 HRS)    EKG   Radiology No results found.  Procedures Procedures (including critical care time)  Medications Ordered in UC Medications - No data to display  Initial Impression / Assessment and Plan / UC Course  I have reviewed the triage vital signs and the nursing notes.  Pertinent labs & imaging results that were available during my care of the patient were reviewed by me and considered in my medical decision making (see chart for details).     Covid-19 test performed in triage. Reviewed PMH, negative covid test a few weeks ago as well as normal CXR Lungs: CTAB, O2 Sat 99% on RA, no indication for repeat CXR at this time. Continue conservative tx F/u with PCP  Work note provided.  Final Clinical Impressions(s) / UC Diagnoses   Final diagnoses:  Cough     Discharge Instructions      No Primary Care Doctor: - Call [facility] at  [phone] - they can help you locate a primary care doctor that  accepts your insurance, provides certain services, etc. - Physician Referral [phone]     ED Prescriptions    None     Controlled Substance Prescriptions [place] Controlled Substance Registry consulted? Not Applicable   Tyrell Antonio 08/21/19 1317

## 2019-08-22 LAB — NOVEL CORONAVIRUS, NAA (HOSP ORDER, SEND-OUT TO REF LAB; TAT 18-24 HRS): SARS-CoV-2, NAA: NOT DETECTED

## 2020-05-11 NOTE — Congregational Nurse Program (Signed)
CN office visit with interpreter Diu Hartshorn assisting. We helped patient complete a Food Stamp application.  She stated that her work hours were cut from 40 to 20 at the end of last month.  Brantley Fling RN, Congregational Nurse [phone]

## 2020-09-29 ENCOUNTER — Other Ambulatory Visit: Payer: Self-pay

## 2020-09-29 ENCOUNTER — Emergency Department (HOSPITAL_COMMUNITY): Payer: BLUE CROSS/BLUE SHIELD

## 2020-09-29 ENCOUNTER — Encounter (HOSPITAL_COMMUNITY): Payer: Self-pay | Admitting: Emergency Medicine

## 2020-09-29 ENCOUNTER — Emergency Department (HOSPITAL_COMMUNITY)
Admission: EM | Admit: 2020-09-29 | Discharge: 2020-09-29 | Disposition: A | Discharge status: Home / Self Care | Payer: BLUE CROSS/BLUE SHIELD | Attending: Emergency Medicine | Admitting: Emergency Medicine

## 2020-09-29 DIAGNOSIS — R059 Cough, unspecified: Secondary | ICD-10-CM | POA: Diagnosis present

## 2020-09-29 DIAGNOSIS — Z20822 Contact with and (suspected) exposure to covid-19: Secondary | ICD-10-CM | POA: Insufficient documentation

## 2020-09-29 DIAGNOSIS — K219 Gastro-esophageal reflux disease without esophagitis: Secondary | ICD-10-CM | POA: Diagnosis not present

## 2020-09-29 DIAGNOSIS — J069 Acute upper respiratory infection, unspecified: Secondary | ICD-10-CM | POA: Diagnosis not present

## 2020-09-29 DIAGNOSIS — R1013 Epigastric pain: Secondary | ICD-10-CM | POA: Insufficient documentation

## 2020-09-29 LAB — CBC WITH DIFFERENTIAL/PLATELET
Abs Immature Granulocytes: 0.02 10*3/uL (ref 0.00–0.07)
Basophils Absolute: 0 10*3/uL (ref 0.0–0.1)
Basophils Relative: 0 %
Eosinophils Absolute: 0.3 10*3/uL (ref 0.0–0.5)
Eosinophils Relative: 3 %
HCT: 42.6 % (ref 36.0–46.0)
Hemoglobin: 14.1 g/dL (ref 12.0–15.0)
Immature Granulocytes: 0 %
Lymphocytes Relative: 28 %
Lymphs Abs: 2.2 10*3/uL (ref 0.7–4.0)
MCH: 30.5 pg (ref 26.0–34.0)
MCHC: 33.1 g/dL (ref 30.0–36.0)
MCV: 92 fL (ref 80.0–100.0)
Monocytes Absolute: 0.8 10*3/uL (ref 0.1–1.0)
Monocytes Relative: 10 %
Neutro Abs: 4.6 10*3/uL (ref 1.7–7.7)
Neutrophils Relative %: 59 %
Platelets: 256 10*3/uL (ref 150–400)
RBC: 4.63 MIL/uL (ref 3.87–5.11)
RDW: 12.9 % (ref 11.5–15.5)
WBC: 7.8 10*3/uL (ref 4.0–10.5)
nRBC: 0 % (ref 0.0–0.2)

## 2020-09-29 LAB — BASIC METABOLIC PANEL
Anion gap: 9 (ref 5–15)
BUN: 10 mg/dL (ref 6–20)
CO2: 26 mmol/L (ref 22–32)
Calcium: 9.1 mg/dL (ref 8.9–10.3)
Chloride: 105 mmol/L (ref 98–111)
Creatinine, Ser: 0.64 mg/dL (ref 0.44–1.00)
GFR calc non Af Amer: >60 mL/min (ref >60)
Glucose, Bld: 105 mg/dL — ABNORMAL HIGH (ref 70–99)
Potassium: 3.3 mmol/L — ABNORMAL LOW (ref 3.5–5.1)
Sodium: 140 mmol/L (ref 135–145)

## 2020-09-29 LAB — URINALYSIS, ROUTINE W REFLEX MICROSCOPIC
Bacteria, UA: NONE SEEN
Bilirubin Urine: NEGATIVE
Glucose, UA: NEGATIVE mg/dL
Ketones, ur: NEGATIVE mg/dL
Leukocytes,Ua: NEGATIVE
Nitrite: NEGATIVE
Protein, ur: NEGATIVE mg/dL
Specific Gravity, Urine: 1.01 (ref 1.005–1.030)
pH: 6 (ref 5.0–8.0)

## 2020-09-29 LAB — RESPIRATORY PANEL BY RT PCR (FLU A&B, COVID)
Influenza A by PCR: NEGATIVE
Influenza B by PCR: NEGATIVE
SARS Coronavirus 2 by RT PCR: NEGATIVE

## 2020-09-29 LAB — HEPATIC FUNCTION PANEL
ALT: 39 U/L (ref 0–44)
AST: 32 U/L (ref 15–41)
Albumin: 4 g/dL (ref 3.5–5.0)
Alkaline Phosphatase: 70 U/L (ref 38–126)
Bilirubin, Direct: <0.1 mg/dL (ref 0.0–0.2)
Total Bilirubin: 0.5 mg/dL (ref 0.3–1.2)
Total Protein: 8.1 g/dL (ref 6.5–8.1)

## 2020-09-29 LAB — LIPASE, BLOOD: Lipase: 44 U/L (ref 11–51)

## 2020-09-29 MED ORDER — POTASSIUM CHLORIDE CRYS ER 20 MEQ PO TBCR
40.0000 meq | EXTENDED_RELEASE_TABLET | Freq: Once | ORAL | Status: AC
  Administered 2020-09-29: 40 meq via ORAL
  Filled 2020-09-29: qty 2

## 2020-09-29 MED ORDER — ALUM & MAG HYDROXIDE-SIMETH 200-200-20 MG/5ML PO SUSP
30.0000 mL | Freq: Once | ORAL | Status: AC
  Administered 2020-09-29: 30 mL via ORAL
  Filled 2020-09-29: qty 30

## 2020-09-29 MED ORDER — PANTOPRAZOLE SODIUM 40 MG PO TBEC: 40.0000 mg | DELAYED_RELEASE_TABLET | Freq: Every day | ORAL | 0 refills | Status: DC

## 2020-09-29 MED ORDER — ACETAMINOPHEN 325 MG PO TABS
650.0000 mg | ORAL_TABLET | Freq: Once | ORAL | Status: AC
  Administered 2020-09-29: 650 mg via ORAL
  Filled 2020-09-29: qty 2

## 2020-09-29 MED ORDER — LIDOCAINE VISCOUS HCL 2 % MT SOLN
15.0000 mL | Freq: Once | OROMUCOSAL | Status: AC
  Administered 2020-09-29: 15 mL via ORAL
  Filled 2020-09-29: qty 15

## 2020-09-29 NOTE — ED Provider Notes (Signed)
[facility] EMERGENCY DEPARTMENT Provider Note   CSN: [id number] Arrival date & time: 09/29/20  [id number]     History Chief Complaint  Patient presents with  . Fever  . Chills    Barbara Hanson is a 54 y.o. female.  HPI 54 year old female presents for acute illness. History is obtained with the help of the [place] translator. The patient has been feeling sick for about 2 days with cough, headache, chills, sore throat. No chest pain or shortness of breath. For about 2 weeks she is also been having upper abdominal discomfort, bloating, heartburn and bitter taste. No vomiting or diarrhea. She has been vaccinated against the novel coronavirus.   History reviewed. No pertinent past medical history.  Patient Active Problem List   Diagnosis Date Noted  . Elevated cholesterol 11/06/2016    History reviewed. No pertinent surgical history.   OB History   No obstetric history on file.     History reviewed. No pertinent family history.  Social History   Tobacco Use  . Smoking status: Never Smoker  . Smokeless tobacco: Never Used  Vaping Use  . Vaping Use: Never used  Substance Use Topics  . Alcohol use: No  . Drug use: No    Home Medications Prior to Admission medications   Medication Sig Start Date End Date Taking? Authorizing Provider  acetaminophen (TYLENOL) 325 MG tablet Take 325 mg by mouth daily as needed for moderate pain or headache.    Yes [provider]  pantoprazole (PROTONIX) 40 MG tablet Take 1 tablet (40 mg total) by mouth daily. 09/29/20   Pricilla Loveless, MD    Allergies    Patient has no known allergies.  Review of Systems   Review of Systems  Constitutional: Positive for chills. Negative for fever.  HENT: Positive for sore throat.   Respiratory: Positive for cough. Negative for shortness of breath.   Cardiovascular: Negative for chest pain.  Gastrointestinal: Positive for abdominal pain. Negative for diarrhea and vomiting.    Neurological: Positive for headaches.  All other systems reviewed and are negative.   Physical Exam Updated Vital Signs BP 120/79   Pulse 85   Temp 97.8 F (36.6 C) (Oral)   Resp (!) 22   LMP 06/13/2012   SpO2 96%   Physical Exam Vitals and nursing note reviewed.  Constitutional:      General: She is not in acute distress.    Appearance: She is well-developed. She is not ill-appearing or diaphoretic.  HENT:     Head: Normocephalic and atraumatic.     Right Ear: External ear normal.     Left Ear: External ear normal.     Nose: Nose normal.     Mouth/Throat:     Pharynx: No oropharyngeal exudate or posterior oropharyngeal erythema.  Eyes:     General:        Right eye: No discharge.        Left eye: No discharge.  Cardiovascular:     Rate and Rhythm: Normal rate and regular rhythm.     Heart sounds: Normal heart sounds.  Pulmonary:     Effort: Pulmonary effort is normal.     Breath sounds: Normal breath sounds. No wheezing, rhonchi or rales.  Abdominal:     General: There is no distension.     Palpations: Abdomen is soft.     Tenderness: There is abdominal tenderness (mild) in the epigastric area.  Skin:    General: Skin is warm and  dry.  Neurological:     Mental Status: She is alert.  Psychiatric:        Mood and Affect: Mood is not anxious.     ED Results / Procedures / Treatments   Labs (all labs ordered are listed, but only abnormal results are displayed) Labs Reviewed  URINALYSIS, ROUTINE W REFLEX MICROSCOPIC - Abnormal; Notable for the following components:      Result Value   Hgb urine dipstick SMALL (*)    All other components within normal limits  BASIC METABOLIC PANEL - Abnormal; Notable for the following components:   Potassium 3.3 (*)    Glucose, Bld 105 (*)    All other components within normal limits  RESPIRATORY PANEL BY RT PCR (FLU A&B, COVID)  CBC WITH DIFFERENTIAL/PLATELET  HEPATIC FUNCTION PANEL  LIPASE, BLOOD    EKG EKG  Interpretation  Date/Time:  Thursday September 29 2020 11:32:09 EDT Ventricular Rate:  84 PR Interval:    QRS Duration: 85 QT Interval:  401 QTC Calculation: 474 R Axis:   85 Text Interpretation: Sinus rhythm Borderline T wave abnormalities Confirmed by Pricilla Loveless [phone]) on 09/29/2020 1:12:09 PM   Radiology DG Chest Portable 1 View  Result Date: 09/29/2020 CLINICAL DATA:  Cough EXAM: PORTABLE CHEST 1 VIEW COMPARISON:  August 2020 FINDINGS: No new consolidation or edema. No pleural effusion or pneumothorax. Stable cardiomediastinal contours with normal heart size. IMPRESSION: No acute process in the chest. Electronically Signed   By: Guadlupe Spanish M.D.   On: 09/29/2020 10:59    Procedures Procedures (including critical care time)  Medications Ordered in ED Medications  potassium chloride SA (KLOR-CON) CR tablet 40 mEq (40 mEq Oral Given 09/29/20 1118)  acetaminophen (TYLENOL) tablet 650 mg (650 mg Oral Given 09/29/20 1118)  alum & mag hydroxide-simeth (MAALOX/MYLANTA) 200-200-20 MG/5ML suspension 30 mL (30 mLs Oral Given 09/29/20 1119)    And  lidocaine (XYLOCAINE) 2 % viscous mouth solution 15 mL (15 mLs Oral Given 09/29/20 1119)    ED Course  I have reviewed the triage vital signs and the nursing notes.  Pertinent labs & imaging results that were available during my care of the patient were reviewed by me and considered in my medical decision making (see chart for details).    MDM Rules/Calculators/A&P                          Patient symptoms are most likely consistent with a viral URI in addition to having GERD.  I discussed her negative results through the interpreter.  Her Covid testing is negative in addition to her chest x-ray and her labs besides mild hypokalemia.  She feels better with GI cocktail.  Otherwise, she appears stable for outpatient supportive care and follow-up. Final Clinical Impression(s) / ED Diagnoses Final diagnoses:  Viral upper respiratory tract  infection  Gastroesophageal reflux disease, unspecified whether esophagitis present    Rx / DC Orders ED Discharge Orders         Ordered    pantoprazole (PROTONIX) 40 MG tablet  Daily,   Status:  Discontinued        09/29/20 1324    pantoprazole (PROTONIX) 40 MG tablet  Daily        09/29/20 1327           Pricilla Loveless, MD 09/29/20 1537

## 2020-09-29 NOTE — ED Notes (Signed)
Pt ambulatory to restroom to give urine sample. Even steady gait, no assistance needed. Denies feeling light headed or dizzy upon returning to room.

## 2020-09-29 NOTE — ED Triage Notes (Signed)
Pt arrives to ED with complaints of fever, chills, cough, aches and headache since x2 days. Has been vaccinated. Denies dysuria.

## 2020-09-29 NOTE — Discharge Instructions (Signed)
If you develop high fever, severe cough or cough with blood, trouble breathing, severe headache, neck pain/stiffness, vomiting, or any other new/concerning symptoms then return to the ER for evaluation    N?u b?n b? s?t cao, ho d? d?i ho?c ho ra mu, kh th?, ?au ??u d? d?i, ?au c? / c?ng c?, nn m?a ho?c b?t k? tri?u ch?ng m?i / lin quan no khc, hy quay l?i Phng khm c?p c?u ?? ?nh gi

## 2020-10-07 ENCOUNTER — Encounter (HOSPITAL_COMMUNITY): Payer: Self-pay

## 2020-10-07 ENCOUNTER — Other Ambulatory Visit: Payer: Self-pay

## 2020-10-07 ENCOUNTER — Ambulatory Visit (HOSPITAL_COMMUNITY)
Admission: EM | Admit: 2020-10-07 | Discharge: 2020-10-07 | Disposition: A | Discharge status: Home / Self Care | Payer: BLUE CROSS/BLUE SHIELD | Attending: Internal Medicine | Admitting: Internal Medicine

## 2020-10-07 DIAGNOSIS — E7800 Pure hypercholesterolemia, unspecified: Secondary | ICD-10-CM | POA: Insufficient documentation

## 2020-10-07 DIAGNOSIS — Z20822 Contact with and (suspected) exposure to covid-19: Secondary | ICD-10-CM | POA: Diagnosis not present

## 2020-10-07 DIAGNOSIS — J069 Acute upper respiratory infection, unspecified: Secondary | ICD-10-CM | POA: Diagnosis present

## 2020-10-07 DIAGNOSIS — Z79899 Other long term (current) drug therapy: Secondary | ICD-10-CM | POA: Insufficient documentation

## 2020-10-07 LAB — SARS CORONAVIRUS 2 (TAT 6-24 HRS): SARS Coronavirus 2: NEGATIVE

## 2020-10-07 MED ORDER — ONDANSETRON 4 MG PO TBDP: 4.0000 mg | ORAL_TABLET | Freq: Three times a day (TID) | ORAL | 0 refills | Status: AC | PRN

## 2020-10-07 MED ORDER — ALBUTEROL SULFATE HFA 108 (90 BASE) MCG/ACT IN AERS: 1.0000 | INHALATION_SPRAY | Freq: Four times a day (QID) | RESPIRATORY_TRACT | 0 refills | Status: DC | PRN

## 2020-10-07 NOTE — ED Triage Notes (Signed)
Pt states she has had a sore throat, fatigue, body aches, and loss sense of smell x 2 weeks. Pt is aox4 and ambulatory.

## 2020-10-07 NOTE — ED Provider Notes (Signed)
Emergency Department Provider Note  ____________________________________________  Time seen: Approximately 7:14 PM  I have reviewed the triage vital signs and the nursing notes.   HISTORY  Chief Complaint Generalized Body Aches (x 2 weeks), Lost sense of smell (x 2 weeks), and Sore Throat (x 2 weeks)   Historian Patient     HPI Barbara Hanson is a 54 y.o. female presents to the emergency department with rhinorrhea, nasal congestion, sporadic cough and loss of taste and smell.  Patient has experienced loss of taste and smell for approximately 2 months.  She denies chest pain, chest tightness or abdominal pain.  There have been no sick contacts in her home.  No other alleviating measures have been attempted.   History reviewed. No pertinent past medical history.   Immunizations up to date:  Yes.     History reviewed. No pertinent past medical history.  Patient Active Problem List   Diagnosis Date Noted  . Elevated cholesterol 11/06/2016    History reviewed. No pertinent surgical history.  Prior to Admission medications   Medication Sig Start Date End Date Taking? Authorizing Provider  acetaminophen (TYLENOL) 325 MG tablet Take 325 mg by mouth daily as needed for moderate pain or headache.     [provider]  albuterol (VENTOLIN HFA) 108 (90 Base) MCG/ACT inhaler Inhale 1-2 puffs into the lungs every 6 (six) hours as needed for wheezing or shortness of breath. 10/07/20   Orvil Feil, PA-C  ondansetron (ZOFRAN ODT) 4 MG disintegrating tablet Take 1 tablet (4 mg total) by mouth every 8 (eight) hours as needed for up to 5 days for nausea or vomiting. 10/07/20 10/12/20  Orvil Feil, PA-C  pantoprazole (PROTONIX) 40 MG tablet Take 1 tablet (40 mg total) by mouth daily. 09/29/20   Pricilla Loveless, MD    Allergies Patient has no known allergies.  History reviewed. No pertinent family history.  Social History Social History   Tobacco Use  . Smoking status:  Never Smoker  . Smokeless tobacco: Never Used  Vaping Use  . Vaping Use: Never used  Substance Use Topics  . Alcohol use: No  . Drug use: No      Review of Systems  Constitutional: Patient has been afebrile.  Eyes: No visual changes. No discharge ENT: Patient has congestion.  Cardiovascular: no chest pain. Respiratory: Patient has cough.  Gastrointestinal: No abdominal pain.  No nausea, no vomiting. Patient had diarrhea.  Genitourinary: Negative for dysuria. No hematuria Musculoskeletal: Patient has myalgias.  Skin: Negative for rash, abrasions, lacerations, ecchymosis. Neurological: Patient has headache, no focal weakness or numbness.     ____________________________________________   PHYSICAL EXAM:  VITAL SIGNS: ED Triage Vitals  Enc Vitals Group     BP 10/07/20 1536 119/62     Pulse Rate 10/07/20 1536 78     Resp 10/07/20 1536 18     Temp 10/07/20 1536 97.9 F (36.6 C)     Temp Source 10/07/20 1536 Oral     SpO2 10/07/20 1536 99 %     Weight --      Height --      Head Circumference --      Peak Flow --      Pain Score 10/07/20 1539 4     Pain Loc --      Pain Edu? --      Excl. in GC? --      Constitutional: Alert and oriented. Patient is lying supine. Eyes: Conjunctivae are normal. PERRL.  EOMI. Head: Atraumatic. ENT:      Ears: Tympanic membranes are mildly injected with mild effusion bilaterally.       Nose: No congestion/rhinnorhea.      Mouth/Throat: Mucous membranes are moist. Posterior pharynx is mildly erythematous.  Hematological/Lymphatic/Immunilogical: No cervical lymphadenopathy.  Cardiovascular: Normal rate, regular rhythm. Normal S1 and S2.  Good peripheral circulation. Respiratory: Normal respiratory effort without tachypnea or retractions. Lungs CTAB. Good air entry to the bases with no decreased or absent breath sounds. Gastrointestinal: Bowel sounds 4 quadrants. Soft and nontender to palpation. No guarding or rigidity. No palpable  masses. No distention. No CVA tenderness. Musculoskeletal: Full range of motion to all extremities. No gross deformities appreciated. Neurologic:  Normal speech and language. No gross focal neurologic deficits are appreciated.  Skin:  Skin is warm, dry and intact. No rash noted. Psychiatric: Mood and affect are normal. Speech and behavior are normal. Patient exhibits appropriate insight and judgement.   ____________________________________________   LABS (all labs ordered are listed, but only abnormal results are displayed)  Labs Reviewed  SARS CORONAVIRUS 2 (TAT 6-24 HRS)   ____________________________________________  EKG   ____________________________________________  RADIOLOGY   No results found.  ____________________________________________    PROCEDURES  Procedure(s) performed:     Procedures     Medications - No data to display   ____________________________________________   INITIAL IMPRESSION / ASSESSMENT AND PLAN / ED COURSE  Pertinent labs & imaging results that were available during my care of the patient were reviewed by me and considered in my medical decision making (see chart for details).      Assessment and Plan:  Viral URI 54 year old female presents to the emergency department with rhinorrhea, nasal congestion, sporadic cough and pharyngitis as well as loss of taste and smell.  Vital signs are reassuring at triage.  On physical exam, patient was alert and active and nontoxic-appearing.  She was resting comfortably.  Sendoff COVID-19 testing is in process at this time.  Patient was discharged with Zofran to be used as needed for nausea and albuterol inhaler to be used for shortness of breath.  Patient was advised to stay quarantined until her COVID-19 results return.  All patient questions were answered.    ____________________________________________  FINAL CLINICAL IMPRESSION(S) / ED DIAGNOSES  Final diagnoses:  Viral upper  respiratory tract infection      NEW MEDICATIONS STARTED DURING THIS VISIT:  ED Discharge Orders         Ordered    ondansetron (ZOFRAN ODT) 4 MG disintegrating tablet  Every 8 hours PRN        10/07/20 1637    albuterol (VENTOLIN HFA) 108 (90 Base) MCG/ACT inhaler  Every 6 hours PRN        10/07/20 1637              This chart was dictated using voice recognition software/Dragon. Despite best efforts to proofread, errors can occur which can change the meaning. Any change was purely unintentional.     Orvil Feil, PA-C 10/07/20 1940

## 2021-01-03 ENCOUNTER — Other Ambulatory Visit: Payer: Self-pay

## 2021-01-03 ENCOUNTER — Ambulatory Visit (HOSPITAL_COMMUNITY)
Admission: EM | Admit: 2021-01-03 | Discharge: 2021-01-03 | Disposition: A | Discharge status: Home / Self Care | Payer: BLUE CROSS/BLUE SHIELD | Attending: Emergency Medicine | Admitting: Emergency Medicine

## 2021-01-03 ENCOUNTER — Encounter (HOSPITAL_COMMUNITY): Payer: Self-pay

## 2021-01-03 DIAGNOSIS — U071 COVID-19: Secondary | ICD-10-CM | POA: Diagnosis not present

## 2021-01-03 DIAGNOSIS — B349 Viral infection, unspecified: Secondary | ICD-10-CM | POA: Diagnosis not present

## 2021-01-03 LAB — POC INFLUENZA A AND B ANTIGEN (URGENT CARE ONLY)
Influenza A Ag: NEGATIVE
Influenza B Ag: NEGATIVE

## 2021-01-03 LAB — SARS CORONAVIRUS 2 (TAT 6-24 HRS): SARS Coronavirus 2: POSITIVE — AB

## 2021-01-03 NOTE — Discharge Instructions (Addendum)
Your flu test is negative.  Your COVID test is pending.  You should self quarantine until the test result is back.   ° °Take Tylenol or ibuprofen as needed for fever or discomfort.  Rest and keep yourself hydrated.   ° °Follow-up with your primary care provider if your symptoms are not improving.   ° ° °

## 2021-01-03 NOTE — ED Triage Notes (Signed)
Pt c/o coughing, sore throat, nasal congestion and headache X 3 days. Pt states has taken tylenol and Alka Seltzer. Pt states she has had SOB and chest pain.

## 2021-01-03 NOTE — ED Provider Notes (Addendum)
[facility]    CSN: [id number] Arrival date & time: 01/03/21  0816      History   Chief Complaint Chief Complaint  Patient presents with  . Nasal Congestion    HPI Barbara Hanson is a 55 y.o. female.   Patient presents with 3-day history of headache, nasal congestion, sore throat, cough, shortness of breath, nausea, diarrhea.  3 episodes of diarrhea today; no emesis.  Treatment attempted at home with Tylenol and Alka-Seltzer.  She felt warm yesterday but did not check her temperature.  She denies rash, vomiting, or other symptoms.  The history is provided by the patient. A language interpreter was used.    History reviewed. No pertinent past medical history.  Patient Active Problem List   Diagnosis Date Noted  . Elevated cholesterol 11/06/2016    History reviewed. No pertinent surgical history.  OB History   No obstetric history on file.      Home Medications    Prior to Admission medications   Medication Sig Start Date End Date Taking? Authorizing Provider  acetaminophen (TYLENOL) 325 MG tablet Take 325 mg by mouth daily as needed for moderate pain or headache.     [provider]  albuterol (VENTOLIN HFA) 108 (90 Base) MCG/ACT inhaler Inhale 1-2 puffs into the lungs every 6 (six) hours as needed for wheezing or shortness of breath. 10/07/20   Orvil Feil, PA-C  pantoprazole (PROTONIX) 40 MG tablet Take 1 tablet (40 mg total) by mouth daily. 09/29/20   Pricilla Loveless, MD    Family History History reviewed. No pertinent family history.  Social History Social History   Tobacco Use  . Smoking status: Never Smoker  . Smokeless tobacco: Never Used  Vaping Use  . Vaping Use: Never used  Substance Use Topics  . Alcohol use: No  . Drug use: No     Allergies   Patient has no known allergies.   Review of Systems Review of Systems  Constitutional: Negative for chills and fever.  HENT: Positive for congestion and sore throat. Negative for  ear pain.   Eyes: Negative for pain and visual disturbance.  Respiratory: Positive for cough and shortness of breath.   Cardiovascular: Negative for chest pain and palpitations.  Gastrointestinal: Positive for diarrhea and nausea. Negative for abdominal pain and vomiting.  Genitourinary: Negative for dysuria and hematuria.  Musculoskeletal: Negative for arthralgias and back pain.  Skin: Negative for color change and rash.  Neurological: Positive for headaches. Negative for dizziness and syncope.  All other systems reviewed and are negative.    Physical Exam Triage Vital Signs ED Triage Vitals  Enc Vitals Group     BP 01/03/21 0921 128/86     Pulse Rate 01/03/21 0921 75     Resp 01/03/21 0921 20     Temp 01/03/21 0921 97.8 F (36.6 C)     Temp Source 01/03/21 0921 Oral     SpO2 01/03/21 0921 98 %     Weight --      Height --      Head Circumference --      Peak Flow --      Pain Score 01/03/21 0918 9     Pain Loc --      Pain Edu? --      Excl. in GC? --    No data found.  Updated Vital Signs BP 128/86 (BP Location: Right Arm)   Pulse 75   Temp 97.8 F (36.6 C) (Oral)  Resp 20   LMP 06/13/2012   SpO2 98%   Visual Acuity Right Eye Distance:   Left Eye Distance:   Bilateral Distance:    Right Eye Near:   Left Eye Near:    Bilateral Near:     Physical Exam Vitals and nursing note reviewed.  Constitutional:      General: She is not in acute distress.    Appearance: She is well-developed and well-nourished.  HENT:     Head: Normocephalic and atraumatic.     Right Ear: Tympanic membrane normal.     Left Ear: Tympanic membrane normal.     Nose: Nose normal.     Mouth/Throat:     Mouth: Mucous membranes are moist.     Pharynx: Oropharynx is clear.  Eyes:     Conjunctiva/sclera: Conjunctivae normal.  Cardiovascular:     Rate and Rhythm: Normal rate and regular rhythm.     Heart sounds: Normal heart sounds.  Pulmonary:     Effort: Pulmonary effort is  normal. No respiratory distress.     Breath sounds: Normal breath sounds.  Abdominal:     Palpations: Abdomen is soft.     Tenderness: There is no abdominal tenderness. There is no guarding or rebound.  Musculoskeletal:        General: No edema.     Cervical back: Neck supple.  Skin:    General: Skin is warm and dry.  Neurological:     General: No focal deficit present.     Mental Status: She is alert and oriented to person, place, and time.  Psychiatric:        Mood and Affect: Mood and affect and mood normal.        Behavior: Behavior normal.      UC Treatments / Results  Labs (all labs ordered are listed, but only abnormal results are displayed) Labs Reviewed  SARS CORONAVIRUS 2 (TAT 6-24 HRS)    EKG   Radiology No results found.  Procedures Procedures (including critical care time)  Medications Ordered in UC Medications - No data to display  Initial Impression / Assessment and Plan / UC Course  I have reviewed the triage vital signs and the nursing notes.  Pertinent labs & imaging results that were available during my care of the patient were reviewed by me and considered in my medical decision making (see chart for details).   Viral illness.  Influenza negative.  COVID pending.  Instructed patient to self quarantine until the test results are back.  Discussed symptomatic treatment including Tylenol, rest, hydration.  Instructed patient to follow up with PCP if her symptoms are not improving  Patient agrees to plan of care.    Final Clinical Impressions(s) / UC Diagnoses   Final diagnoses:  Viral illness     Discharge Instructions     Your flu test is negative.    Your COVID test is pending.  You should self quarantine until the test result is back.    Take Tylenol or ibuprofen as needed for fever or discomfort.  Rest and keep yourself hydrated.    Follow-up with your primary care provider if your symptoms are not improving.        ED  Prescriptions    None     PDMP not reviewed this encounter.   Mickie Bail, NP 01/03/21 1015    Mickie Bail, NP 01/03/21 1015

## 2021-02-28 IMAGING — DX PORTABLE CHEST - 1 VIEW
1 series · 1 of 1 positions shown · non-contrast
Comparison: 07/18/2019

CLINICAL DATA: Cough, 7QR2D-3T exposure

EXAM:
PORTABLE CHEST 1 VIEW

[chest]
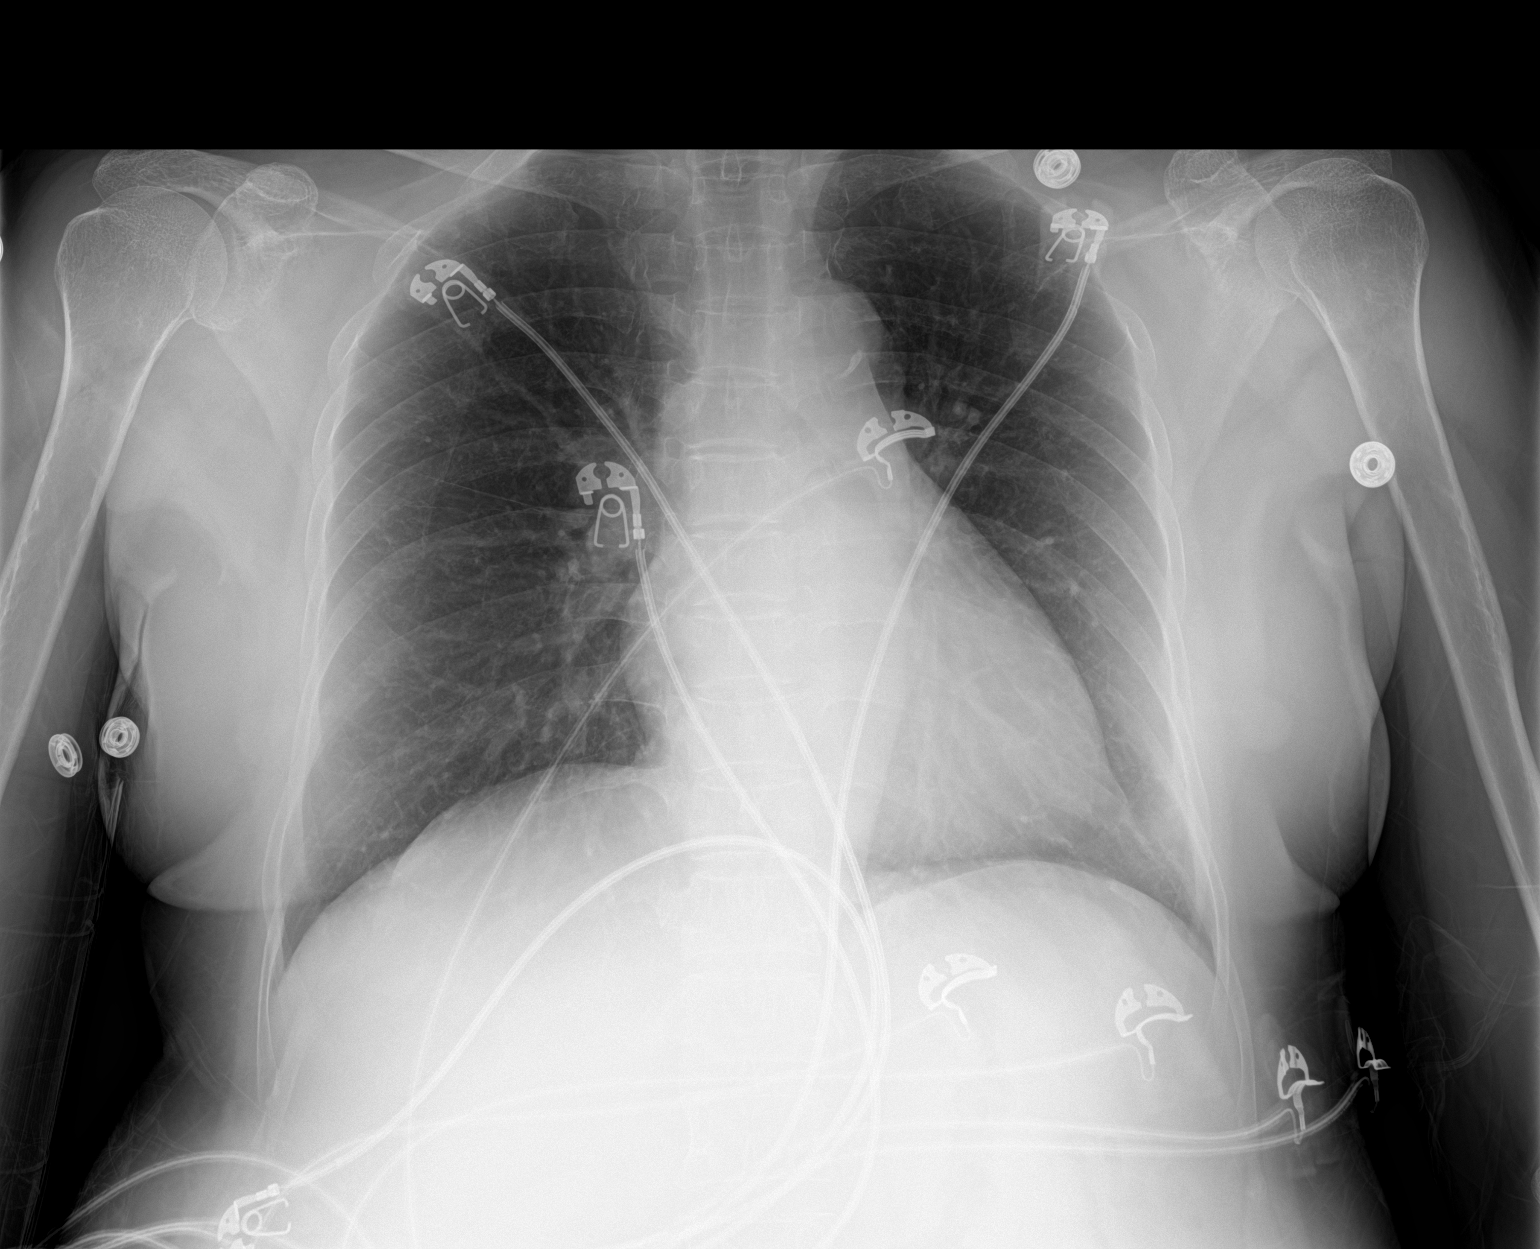

[1 of 1 positions shown; findings below may reference images not displayed]

FINDINGS: Cardiomegaly. Both lungs are clear. The visualized skeletal
structures are unremarkable.
IMPRESSION: Cardiomegaly without acute abnormality of the lungs. No airspace
opacity.

## 2023-06-07 ENCOUNTER — Emergency Department (HOSPITAL_COMMUNITY): Payer: BLUE CROSS/BLUE SHIELD

## 2023-06-07 ENCOUNTER — Encounter (HOSPITAL_COMMUNITY): Payer: Self-pay

## 2023-06-07 ENCOUNTER — Other Ambulatory Visit: Payer: Self-pay

## 2023-06-07 ENCOUNTER — Emergency Department (HOSPITAL_COMMUNITY)
Admission: EM | Admit: 2023-06-07 | Discharge: 2023-06-07 | Disposition: A | Discharge status: Home / Self Care | Payer: BLUE CROSS/BLUE SHIELD | Attending: Emergency Medicine | Admitting: Emergency Medicine

## 2023-06-07 DIAGNOSIS — R739 Hyperglycemia, unspecified: Secondary | ICD-10-CM | POA: Diagnosis not present

## 2023-06-07 DIAGNOSIS — R079 Chest pain, unspecified: Secondary | ICD-10-CM | POA: Diagnosis not present

## 2023-06-07 DIAGNOSIS — N281 Cyst of kidney, acquired: Secondary | ICD-10-CM | POA: Diagnosis not present

## 2023-06-07 DIAGNOSIS — Y92410 Unspecified street and highway as the place of occurrence of the external cause: Secondary | ICD-10-CM | POA: Insufficient documentation

## 2023-06-07 DIAGNOSIS — R109 Unspecified abdominal pain: Secondary | ICD-10-CM | POA: Diagnosis present

## 2023-06-07 HISTORY — DX: Essential (primary) hypertension: I10

## 2023-06-07 LAB — I-STAT CHEM 8, ED
BUN: 12 mg/dL (ref 6–20)
Calcium, Ion: 1.16 mmol/L (ref 1.15–1.40)
Chloride: 107 mmol/L (ref 98–111)
Creatinine, Ser: 0.5 mg/dL (ref 0.44–1.00)
Glucose, Bld: 152 mg/dL — ABNORMAL HIGH (ref 70–99)
HCT: 40 % (ref 36.0–46.0)
Hemoglobin: 13.6 g/dL (ref 12.0–15.0)
Potassium: 3.4 mmol/L — ABNORMAL LOW (ref 3.5–5.1)
Sodium: 142 mmol/L (ref 135–145)
TCO2: 24 mmol/L (ref 22–32)

## 2023-06-07 MED ORDER — ONDANSETRON HCL 4 MG/2ML IJ SOLN
4.0000 mg | Freq: Once | INTRAMUSCULAR | Status: AC
  Administered 2023-06-07: 4 mg via INTRAVENOUS
  Filled 2023-06-07: qty 2

## 2023-06-07 MED ORDER — IOHEXOL 350 MG/ML SOLN
75.0000 mL | Freq: Once | INTRAVENOUS | Status: AC | PRN
  Administered 2023-06-07: 75 mL via INTRAVENOUS
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
## 2023-06-07 NOTE — ED Notes (Signed)
During discharge patient requested nausea meds, Ray, MD notified.
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
## 2023-06-07 NOTE — Discharge Instructions (Signed)
Please take acetaminophen and ibuprofen as needed for pain Please follow-up with your doctor for recheck of your blood sugar and renal cyst noted today. Return if you are having any new or worsening pain

## 2024-01-08 ENCOUNTER — Encounter (HOSPITAL_COMMUNITY): Payer: Self-pay | Admitting: Emergency Medicine

## 2024-01-08 ENCOUNTER — Ambulatory Visit (HOSPITAL_COMMUNITY)
Admission: EM | Admit: 2024-01-08 | Discharge: 2024-01-08 | Disposition: A | Discharge status: Home / Self Care | Payer: Self-pay | Attending: Emergency Medicine | Admitting: Emergency Medicine

## 2024-01-08 ENCOUNTER — Ambulatory Visit (INDEPENDENT_AMBULATORY_CARE_PROVIDER_SITE_OTHER): Payer: Self-pay

## 2024-01-08 DIAGNOSIS — R051 Acute cough: Secondary | ICD-10-CM

## 2024-01-08 DIAGNOSIS — J22 Unspecified acute lower respiratory infection: Secondary | ICD-10-CM

## 2024-01-08 MED ORDER — AZITHROMYCIN 250 MG PO TABS: 250.0000 mg | ORAL_TABLET | Freq: Every day | ORAL | 0 refills | Status: DC

## 2024-01-08 MED ORDER — BENZONATATE 200 MG PO CAPS: 200.0000 mg | ORAL_CAPSULE | Freq: Three times a day (TID) | ORAL | 0 refills | Status: DC

## 2024-01-08 NOTE — Discharge Instructions (Addendum)
 X-ray yo ey'ekifuba yali yeeraliikiriza ku kifuba. Eddagala eritta obuwuka limira nga bwe liragiddwa era okutuusa lw'omala. Osobola okukozesa eddagala ly'okusesema buli luvannyuma lwa ssaawa 8 nga bwe kyetaagisa. ZOXWRU nti osigala ng'olina amazzi amangi ng'onywa amazzi waakiri awunnsi 64 buli lunaku era ng'owummula nnyo. Osobola okukyusakyusa wakati wa 600 mg za ibuprofen  ne 500 mg za Tylenol  buli ssaawa 4-6 ku musujja gwonna oba okulumwa omubiri.  Obubonero bulina okutereera n'eddagala eritta obuwuka, bwe kiba nga tewali nkulaakulana wadde nga bamalirizza eddagala eritta obuwuka oba obubonero bwonna obupya obweraliikiriza buddayo mu ddwaaliro okuddamu okwekenneenya.  Your chest x-ray was concerning for pneumonia.  Take the antibiotics as prescribed and until finished.  You can use the cough medicine every 8 hours as needed.  Ensure you are staying well-hydrated with at least 64 ounces of water daily and getting plenty of rest.  You can alternate between 600 mg of ibuprofen  and 500 mg of Tylenol  every 4-6 hours for any fever or bodyaches.  Symptoms should improve with the antibiotics, if no improvement despite finishing antibiotics or any new concerning symptoms return to clinic for reevaluation.

## 2024-01-08 NOTE — ED Triage Notes (Signed)
 Pt had cough, sore throat and fevers for 2 weeks. Cough is non productive. Taking Alka seltzer cold medication.

## 2024-01-08 NOTE — ED Provider Notes (Addendum)
 [facility]    CSN: [id number] Arrival date & time: 01/08/24  4098      History   Chief Complaint Chief Complaint  Patient presents with   Cough    HPI Barbara Hanson is a 58 y.o. female.   Patient presents to clinic for complaint of cough, fever and feeling unwell for the past two weeks. Headache as well. Subjective fever. No recent sick contacts.   No shortness of breath or wheezing. No emesis or diarrhea.   Taking alka-seltzer cold and flu medication without improvement.   Hx of HTN, no other medical issues.   The history is provided by the patient and medical records. The history is limited by a language barrier. A language interpreter was used [place]).  Cough   Past Medical History:  Diagnosis Date   Hypertension     Patient Active Problem List   Diagnosis Date Noted   Elevated cholesterol 11/06/2016    History reviewed. No pertinent surgical history.  OB History   No obstetric history on file.      Home Medications    Prior to Admission medications   Medication Sig Start Date End Date Taking? Authorizing Provider  azithromycin  (ZITHROMAX ) 250 MG tablet Take 1 tablet (250 mg total) by mouth daily. Take first 2 tablets together, then 1 every day until finished. 01/08/24  Yes Daisha Filosa  N, FNP  benzonatate  (TESSALON ) 200 MG capsule Take 1 capsule (200 mg total) by mouth every 8 (eight) hours. 01/08/24  Yes Harlow Lighter, Jachelle Fluty  N, FNP    Family History No family history on file.  Social History Social History   Tobacco Use   Smoking status: Never   Smokeless tobacco: Never  Vaping Use   Vaping status: Never Used  Substance Use Topics   Alcohol use: No   Drug use: No     Allergies   Patient has no known allergies.   Review of Systems Review of Systems  Per HPI  Physical Exam Triage Vital Signs ED Triage Vitals  Encounter Vitals Group     BP 01/08/24 0951 127/80     Systolic BP Percentile --      Diastolic BP  Percentile --      Pulse Rate 01/08/24 0951 81     Resp 01/08/24 0951 18     Temp 01/08/24 0951 98 F (36.7 C)     Temp Source 01/08/24 0951 Oral     SpO2 01/08/24 0951 97 %     Weight --      Height --      Head Circumference --      Peak Flow --      Pain Score 01/08/24 0949 8     Pain Loc --      Pain Education --      Exclude from Growth Chart --    No data found.  Updated Vital Signs BP 127/80 (BP Location: Right Arm)   Pulse 81   Temp 98 F (36.7 C) (Oral)   Resp 18   LMP 06/13/2012   SpO2 97%   Visual Acuity Right Eye Distance:   Left Eye Distance:   Bilateral Distance:    Right Eye Near:   Left Eye Near:    Bilateral Near:     Physical Exam Vitals and nursing note reviewed.  Constitutional:      Appearance: Normal appearance.  HENT:     Head: Normocephalic and atraumatic.     Right Ear: External ear normal.  Left Ear: External ear normal.     Nose: Nose normal.     Mouth/Throat:     Mouth: Mucous membranes are moist.  Eyes:     Conjunctiva/sclera: Conjunctivae normal.  Cardiovascular:     Rate and Rhythm: Normal rate and regular rhythm.     Heart sounds: Normal heart sounds. No murmur heard. Pulmonary:     Effort: Pulmonary effort is normal. No respiratory distress.     Breath sounds: Normal breath sounds.  Musculoskeletal:        General: Normal range of motion.  Skin:    General: Skin is warm and dry.  Neurological:     General: No focal deficit present.     Mental Status: She is alert and oriented to person, place, and time.  Psychiatric:        Mood and Affect: Mood normal.        Behavior: Behavior normal.      UC Treatments / Results  Labs (all labs ordered are listed, but only abnormal results are displayed) Labs Reviewed - No data to display  EKG   Radiology No results found.  Procedures Procedures (including critical care time)  Medications Ordered in UC Medications - No data to display  Initial Impression /  Assessment and Plan / UC Course  I have reviewed the triage vital signs and the nursing notes.  Pertinent labs & imaging results that were available during my care of the patient were reviewed by me and considered in my medical decision making (see chart for details).  Vitals and triage reviewed, patient is hemodynamically stable.  Lungs are vesicular, heart with regular rate and rhythm.  Subjective fever and dry cough for the past 2 weeks.  Chest x-ray with some suspicious changes in the right middle lobe, more prominent on today's x-ray than previous.  Official radiology overread is pending.  Due to duration of symptoms and changes, will cover with azithromycin  over concern for atypical pneumonia.  Plan of care, follow-up care return precautions given, no questions at this time.  CXR interpretation is no acute cardiopulmonary diseases, no changes in management.     Final Clinical Impressions(s) / UC Diagnoses   Final diagnoses:  Acute cough  Lower respiratory infection     Discharge Instructions      X-ray yo ey'ekifuba yali yeeraliikiriza ku kifuba. Eddagala eritta obuwuka limira nga bwe liragiddwa era okutuusa lw'omala. Osobola okukozesa eddagala ly'okusesema buli luvannyuma lwa ssaawa 8 nga bwe kyetaagisa. ZOXWRU nti osigala ng'olina amazzi amangi ng'onywa amazzi waakiri awunnsi 64 buli lunaku era ng'owummula nnyo. Osobola okukyusakyusa wakati wa 600 mg za ibuprofen  ne 500 mg za Tylenol  buli ssaawa 4-6 ku musujja gwonna oba okulumwa omubiri.  Obubonero bulina okutereera n'eddagala eritta obuwuka, bwe kiba nga tewali nkulaakulana wadde nga bamalirizza eddagala eritta obuwuka oba obubonero bwonna obupya obweraliikiriza buddayo mu ddwaaliro okuddamu okwekenneenya.  Your chest x-ray was concerning for pneumonia.  Take the antibiotics as prescribed and until finished.  You can use the cough medicine every 8 hours as needed.  Ensure you are staying well-hydrated with at least 64 ounces of  water daily and getting plenty of rest.  You can alternate between 600 mg of ibuprofen  and 500 mg of Tylenol  every 4-6 hours for any fever or bodyaches.  Symptoms should improve with the antibiotics, if no improvement despite finishing antibiotics or any new concerning symptoms return to clinic for reevaluation.      ED Prescriptions     Medication Sig Dispense  Auth. Provider   azithromycin  (ZITHROMAX ) 250 MG tablet Take 1 tablet (250 mg total) by mouth daily. Take first 2 tablets together, then 1 every day until finished. 6 tablet Harlow Lighter, Freeman Borba  N, FNP   benzonatate  (TESSALON ) 200 MG capsule Take 1 capsule (200 mg total) by mouth every 8 (eight) hours. 30 capsule Harlow Lighter, Monserath Neff  N, FNP      PDMP not reviewed this encounter.   Murel Arlington, FNP 01/08/24 1158    Harlow Lighter, Theona Muhs  N, FNP 01/08/24 1233

## 2024-06-28 ENCOUNTER — Other Ambulatory Visit: Payer: Self-pay

## 2024-06-28 ENCOUNTER — Emergency Department (HOSPITAL_COMMUNITY)

## 2024-06-28 ENCOUNTER — Emergency Department (HOSPITAL_COMMUNITY)
Admission: EM | Admit: 2024-06-28 | Discharge: 2024-06-28 | Disposition: A | Discharge status: Home / Self Care | Attending: Emergency Medicine | Admitting: Emergency Medicine

## 2024-06-28 ENCOUNTER — Encounter (HOSPITAL_COMMUNITY): Payer: Self-pay | Admitting: Emergency Medicine

## 2024-06-28 DIAGNOSIS — R0981 Nasal congestion: Secondary | ICD-10-CM | POA: Insufficient documentation

## 2024-06-28 LAB — CBC
HCT: 41.3 % (ref 36.0–46.0)
Hemoglobin: 13.9 g/dL (ref 12.0–15.0)
MCH: 30 pg (ref 26.0–34.0)
MCHC: 33.7 g/dL (ref 30.0–36.0)
MCV: 89 fL (ref 80.0–100.0)
Platelets: 259 K/uL (ref 150–400)
RBC: 4.64 MIL/uL (ref 3.87–5.11)
RDW: 12.6 % (ref 11.5–15.5)
WBC: 6.6 K/uL (ref 4.0–10.5)
nRBC: 0 % (ref 0.0–0.2)

## 2024-06-28 LAB — BASIC METABOLIC PANEL WITH GFR
Anion gap: 9 (ref 5–15)
BUN: 17 mg/dL (ref 6–20)
CO2: 24 mmol/L (ref 22–32)
Calcium: 9 mg/dL (ref 8.9–10.3)
Chloride: 106 mmol/L (ref 98–111)
Creatinine, Ser: 0.51 mg/dL (ref 0.44–1.00)
GFR, Estimated: >60 mL/min (ref >60)
Glucose, Bld: 127 mg/dL — ABNORMAL HIGH (ref 70–99)
Potassium: 3.5 mmol/L (ref 3.5–5.1)
Sodium: 139 mmol/L (ref 135–145)

## 2024-06-28 MED ORDER — GUAIFENESIN 100 MG/5ML PO LIQD: 100.0000 mg | ORAL | 0 refills | Status: AC | PRN

## 2024-06-28 MED ORDER — AMOXICILLIN-POT CLAVULANATE 875-125 MG PO TABS: 1.0000 | ORAL_TABLET | Freq: Two times a day (BID) | ORAL | 0 refills | Status: AC

## 2024-06-28 MED ORDER — FLUTICASONE PROPIONATE 50 MCG/ACT NA SUSP: 1.0000 | Freq: Every day | NASAL | 0 refills | Status: AC

## 2024-06-28 NOTE — ED Notes (Signed)
 Patient has a SAVE BLUE TUBE in the lab.

## 2024-06-28 NOTE — ED Triage Notes (Signed)
 Pt reports waking up from sleep feeling SOB since 0400 this am, pt denies resp problems, had similar episode but has been years

## 2024-06-28 NOTE — ED Provider Notes (Signed)
 [place] EMERGENCY DEPARTMENT AT [facility] Provider Note   CSN: [id number] Arrival date & time: 06/28/24  9383     Patient presents with: Shortness of Breath   Barbara Hanson is a 58 y.o. female.   The history is provided by the patient and medical records. The history is limited by a language barrier. A language interpreter was used.  Shortness of Breath    58 year old female who speak Rade presented to the ED for evaluation of congestion.  History obtained using myself as language interpreter as patient was able to speak in [place]  Patient report for the past several days she has had sinus congestion and pressure around her face.  States it makes it a bit difficult for her to breathe.  She does not endorse any fever or chills denies any chest pain having shortness of breath or productive cough.  She mention she has had similar symptoms like this in the past and was given some medication that she took for about a month and her symptoms resolved.  Her symptom has since returned.  She does not endorse any skin rash or itchiness no abdominal cramping no severe headache.  She denies alcohol or tobacco use.  She does not have any cardiac history.  She does not have any history of COPD or asthma.  Denies any recent environmental changes.  Prior to Admission medications   Medication Sig Start Date End Date Taking? Authorizing Provider  azithromycin  (ZITHROMAX ) 250 MG tablet Take 1 tablet (250 mg total) by mouth daily. Take first 2 tablets together, then 1 every day until finished. 01/08/24   Dreama, [place]  N, FNP  benzonatate  (TESSALON ) 200 MG capsule Take 1 capsule (200 mg total) by mouth every 8 (eight) hours. 01/08/24   Dreama, [place]  N, FNP    Allergies: Patient has no known allergies.    Review of Systems  Respiratory:  Positive for shortness of breath.   All other systems reviewed and are negative.   Updated Vital Signs BP (!) 149/89   Pulse 82   Temp 98 F (36.7  C) (Oral)   Resp (!) 22   Ht 5' 3 (1.6 m)   Wt 56.7 kg   LMP 06/13/2012   SpO2 97%   BMI 22.14 kg/m   Physical Exam Vitals and nursing note reviewed.  Constitutional:      General: She is not in acute distress.    Appearance: She is well-developed.  HENT:     Head: Atraumatic.     Mouth/Throat:     Mouth: Mucous membranes are moist.  Eyes:     Conjunctiva/sclera: Conjunctivae normal.  Neck:     Thyroid: No thyromegaly.  Cardiovascular:     Rate and Rhythm: Normal rate and regular rhythm.  Pulmonary:     Effort: Pulmonary effort is normal.     Breath sounds: No decreased breath sounds, wheezing, rhonchi or rales.  Chest:     Chest wall: No tenderness.  Musculoskeletal:     Cervical back: Normal range of motion and neck supple.     Right lower leg: No edema.     Left lower leg: No edema.  Lymphadenopathy:     Cervical: No cervical adenopathy.  Skin:    Findings: No rash.  Neurological:     Mental Status: She is alert.  Psychiatric:        Mood and Affect: Mood normal.     (all labs ordered are listed, but only abnormal results are  displayed) Labs Reviewed  BASIC METABOLIC PANEL WITH GFR - Abnormal; Notable for the following components:      Result Value   Glucose, Bld 127 (*)    All other components within normal limits  CBC    EKG: None  Date: 06/28/2024  Rate: 78  Rhythm: normal sinus rhythm  QRS Axis: normal  Intervals: normal  ST/T Wave abnormalities: normal  Conduction Disutrbances: none  Narrative Interpretation:   Old EKG Reviewed: No significant changes noted    Radiology: DG Chest 2 View Result Date: 06/28/2024 CLINICAL DATA:  58 year old female with shortness of breath onset 0400 hours today. EXAM: CHEST - 2 VIEW COMPARISON:  Chest radiographs 01/08/2024. FINDINGS: PA and lateral views ear 0646 hours. Normal lung volumes on the PA, low on the lateral. Calcified aortic atherosclerosis. Other mediastinal contours are within normal limits.  Visualized tracheal air column is within normal limits. Both lungs are clear. Some attenuation of bilateral upper lobe bronchovascular markings, but no convincing emphysema on chest CT last year. No pneumothorax or pleural effusion. Negative visible bowel gas. Subtle chronic wedging of the T12 vertebral body is unchanged from CT last year. No acute osseous abnormality identified. IMPRESSION: 1. No acute cardiopulmonary abnormality. 2. Aortic Atherosclerosis (ICD10-I70.0). Electronically Signed   By: VEAR Hurst M.D.   On: 06/28/2024 07:02     Procedures   Medications Ordered in the ED - No data to display                                  Medical Decision Making Amount and/or Complexity of Data Reviewed Labs: ordered. Radiology: ordered.   BP (!) 149/89   Pulse 82   Temp 98 F (36.7 C) (Oral)   Resp (!) 22   Ht 5' 3 (1.6 m)   Wt 56.7 kg   LMP 06/13/2012   SpO2 97%   BMI 22.14 kg/m   18:41 AM   58 year old female who speak Rade presented to the ED for evaluation of congestion.  History obtained using myself as language interpreter as patient was able to speak in [place]  Patient report for the past several days she has had sinus congestion and pressure around her face.  States it makes it a bit difficult for her to breathe.  She does not endorse any fever or chills denies any chest pain having shortness of breath or productive cough.  She mention she has had similar symptoms like this in the past and was given some medication that she took for about a month and her symptoms resolved.  Her symptom has since returned.  She does not endorse any skin rash or itchiness no abdominal cramping no severe headache.  She denies alcohol or tobacco use.  She does not have any cardiac history.  She does not have any history of COPD or asthma.  Denies any recent environmental changes.   On exam patient is resting comfortably appears to be in no acute respiratory discomfort.  Ear nose and throat exam  unremarkable.  Her lungs are clear to auscultation her abdomen is soft nontender, she has normal skin turgor, she is speaking in complete sentences and in no acute distress.  Vital sign overall reassuring no fever no hypoxia.  No significant tenderness to percussion facial sinuses  -Labs ordered, independently viewed and interpreted by me.  Labs remarkable for normal WBC, normal electrolyte panel -The patient was maintained on a cardiac  monitor.  I personally viewed and interpreted the cardiac monitored which showed an underlying rhythm of: Normal sinus rhythm -Imaging independently viewed and interpreted by me and I agree with radiologist's interpretation.  Result remarkable for chest x-ray unremarkable -This patient presents to the ED for concern of sinus congestion, this involves an extensive number of treatment options, and is a complaint that carries with it a high risk of complications and morbidity.  The differential diagnosis includes sinusitis, seasonal allergies, viral illness, asthma, COPD, pneumonia, allergic reaction -Co morbidities that complicate the patient evaluation includes none -Treatment includes supportive care -Reevaluation of the patient after these medicines showed that the patient stayed the same -PCP office notes or outside notes reviewed -Escalation to admission/observation considered: patients feels much better, is comfortable with discharge, and will follow up with PCP -Prescription medication considered, patient comfortable with guaifenessin, Augmentin , Flonase  -Social Determinant of Health considered   Workup overall reassuring.  Chest x-ray without any signs of pneumonia.  Labs overall normal.  EKG did not show any concerning cardiac arrhythmia or ischemic changes.      Final diagnoses:  Sinus congestion    ED Discharge Orders          Ordered    amoxicillin -clavulanate (AUGMENTIN ) 875-125 MG tablet  Every 12 hours        06/28/24 0738    fluticasone   (FLONASE ) 50 MCG/ACT nasal spray  Daily        06/28/24 0738    guaiFENesin  (ROBITUSSIN) 100 MG/5ML liquid  Every 4 hours PRN        06/28/24 0738               Nivia Colon, PA-C 06/28/24 9260    Dean Clarity, MD 06/28/24 [phone]

## 2024-11-04 NOTE — Congregational Nurse Program (Signed)
 Helped patient complete application for food stamps.  Advised her to go to DSS to turn it in.  Elveria Rummer RN, Congregational Nurse [phone]

## 2024-11-11 NOTE — Congregational Nurse Program (Signed)
 CN office visit with interpreter Loralyn Jenkins and [employer] intern Meghann Kaczynski assisting.  Patient had received mail from DSS regarding her recent FNS application.  Meghann attempted to help obtain previous job information for husband and spouse who had not worked since 2007.  Patient told she also needs copy of property taxes and insurance.  Since all this information isdue by 11/15/2024 patient will most likely need to reapply once she has everything requested.  Elveria Rummer RN, Congregational Nurse [phone].

## 2024-11-18 NOTE — Congregational Nurse Program (Signed)
 CN office visit to work on [employer] application.  [employer] intern Meghann Kaczynski assisted patient by obtaining her pay stubs and property tax info.  She still needs proof of homeowners insurance premium and employment information for her husband.  Elveria Rummer RN, Congregational Nurse [phone]

## 2024-12-02 NOTE — Congregational Nurse Program (Signed)
 CN office visit with interpreter Diu Hartshorn assisting.  Helped patient complete a Medicaid application.  Elveria Rummer RN, Congregational Nurse [phone]

## 2024-12-09 NOTE — Congregational Nurse Program (Signed)
 CN office visit with interpreter Diu Hartshorn assisting.  Patient received letter from DSS Medicaid worker Amanda Champion stating she had received new application but that since there is already an open application on file the new one was denied. Phone call to worker during which patient provided her husband's income information stating he makes $9000 per year and  that he is a engineer, maintenance. She will forward to [employer] worker assigned to original application.  Ms. Lindajo told patient to watch for mail from DSS.  Elveria Rummer RN, Congregational Nurse  [phone]
